# Patient Record
Sex: Female | Born: 1935 | Smoking: Former smoker
Health system: Southern US, Community
[De-identification: ages and names within clinical notes are randomized; demographics above are authoritative.]

## PROBLEM LIST (undated history)

## (undated) DIAGNOSIS — C50919 Malignant neoplasm of unspecified site of unspecified female breast: Secondary | ICD-10-CM

## (undated) DIAGNOSIS — K219 Gastro-esophageal reflux disease without esophagitis: Secondary | ICD-10-CM

## (undated) DIAGNOSIS — Z8719 Personal history of other diseases of the digestive system: Secondary | ICD-10-CM

## (undated) DIAGNOSIS — I10 Essential (primary) hypertension: Secondary | ICD-10-CM

## (undated) DIAGNOSIS — N189 Chronic kidney disease, unspecified: Secondary | ICD-10-CM

## (undated) DIAGNOSIS — R600 Localized edema: Secondary | ICD-10-CM

## (undated) DIAGNOSIS — M1990 Unspecified osteoarthritis, unspecified site: Secondary | ICD-10-CM

## (undated) HISTORY — DX: Malignant neoplasm of unspecified site of unspecified female breast: C50.919

## (undated) HISTORY — DX: Localized edema: R60.0

## (undated) HISTORY — DX: Essential (primary) hypertension: I10

## (undated) HISTORY — PX: SHOULDER SURGERY: SHX246

## (undated) HISTORY — DX: Unspecified osteoarthritis, unspecified site: M19.90

## (undated) HISTORY — DX: Gastro-esophageal reflux disease without esophagitis: K21.9

## (undated) HISTORY — DX: Personal history of other diseases of the digestive system: Z87.19

## (undated) HISTORY — PX: HIP ARTHROPLASTY: SHX981

## (undated) HISTORY — PX: CHOLECYSTECTOMY: SHX55

## (undated) HISTORY — PX: MASTECTOMY: SHX3

## (undated) HISTORY — DX: Chronic kidney disease, unspecified: N18.9

---

## 2004-01-13 ENCOUNTER — Other Ambulatory Visit: Payer: Self-pay

## 2004-07-30 ENCOUNTER — Emergency Department: Payer: Self-pay | Admitting: Unknown Physician Specialty

## 2004-12-16 ENCOUNTER — Emergency Department: Payer: Self-pay | Admitting: Emergency Medicine

## 2005-10-09 ENCOUNTER — Other Ambulatory Visit: Payer: Self-pay

## 2005-10-09 ENCOUNTER — Emergency Department: Payer: Self-pay | Admitting: Emergency Medicine

## 2005-10-15 ENCOUNTER — Other Ambulatory Visit: Payer: Self-pay

## 2005-10-15 ENCOUNTER — Inpatient Hospital Stay: Payer: Self-pay | Admitting: Internal Medicine

## 2005-12-31 ENCOUNTER — Ambulatory Visit: Payer: Self-pay | Admitting: Internal Medicine

## 2006-01-11 ENCOUNTER — Ambulatory Visit: Payer: Self-pay | Admitting: Internal Medicine

## 2006-01-23 ENCOUNTER — Emergency Department: Payer: Self-pay | Admitting: Emergency Medicine

## 2006-01-28 ENCOUNTER — Ambulatory Visit: Payer: Self-pay | Admitting: Surgery

## 2006-01-28 ENCOUNTER — Other Ambulatory Visit: Payer: Self-pay

## 2006-02-15 ENCOUNTER — Ambulatory Visit: Payer: Self-pay | Admitting: Surgery

## 2006-03-20 ENCOUNTER — Inpatient Hospital Stay: Payer: Self-pay | Admitting: Internal Medicine

## 2006-03-20 ENCOUNTER — Other Ambulatory Visit: Payer: Self-pay

## 2006-04-16 ENCOUNTER — Inpatient Hospital Stay: Payer: Self-pay | Admitting: Surgery

## 2006-11-06 ENCOUNTER — Other Ambulatory Visit: Payer: Self-pay

## 2006-11-06 ENCOUNTER — Ambulatory Visit: Payer: Self-pay | Admitting: Specialist

## 2006-11-20 ENCOUNTER — Ambulatory Visit: Payer: Self-pay | Admitting: Specialist

## 2006-12-14 ENCOUNTER — Other Ambulatory Visit: Payer: Self-pay

## 2006-12-14 ENCOUNTER — Inpatient Hospital Stay: Payer: Self-pay | Admitting: Internal Medicine

## 2006-12-15 ENCOUNTER — Other Ambulatory Visit: Payer: Self-pay

## 2007-01-14 ENCOUNTER — Emergency Department: Payer: Self-pay | Admitting: Emergency Medicine

## 2007-02-12 ENCOUNTER — Other Ambulatory Visit: Payer: Self-pay

## 2007-02-12 ENCOUNTER — Inpatient Hospital Stay: Payer: Self-pay | Admitting: Internal Medicine

## 2007-12-02 ENCOUNTER — Emergency Department: Payer: Self-pay | Admitting: Unknown Physician Specialty

## 2007-12-02 ENCOUNTER — Other Ambulatory Visit: Payer: Self-pay

## 2008-11-10 ENCOUNTER — Emergency Department: Payer: Self-pay | Admitting: Emergency Medicine

## 2009-04-20 ENCOUNTER — Emergency Department: Payer: Self-pay | Admitting: Emergency Medicine

## 2009-05-26 ENCOUNTER — Emergency Department: Payer: Self-pay | Admitting: Internal Medicine

## 2009-05-31 ENCOUNTER — Inpatient Hospital Stay: Payer: Self-pay | Admitting: Internal Medicine

## 2009-07-16 ENCOUNTER — Emergency Department: Payer: Self-pay | Admitting: Internal Medicine

## 2009-08-06 ENCOUNTER — Observation Stay: Payer: Self-pay | Admitting: Internal Medicine

## 2010-11-21 ENCOUNTER — Inpatient Hospital Stay: Payer: Self-pay | Admitting: Internal Medicine

## 2010-11-21 ENCOUNTER — Encounter: Payer: Self-pay | Admitting: Cardiovascular Disease

## 2010-11-21 DIAGNOSIS — R079 Chest pain, unspecified: Secondary | ICD-10-CM

## 2010-11-21 DIAGNOSIS — I369 Nonrheumatic tricuspid valve disorder, unspecified: Secondary | ICD-10-CM

## 2010-11-22 ENCOUNTER — Encounter: Payer: Self-pay | Admitting: Cardiovascular Disease

## 2010-12-01 ENCOUNTER — Ambulatory Visit: Payer: Medicare Other | Admitting: Cardiovascular Disease

## 2010-12-12 ENCOUNTER — Encounter: Payer: Self-pay | Admitting: Cardiovascular Disease

## 2010-12-12 ENCOUNTER — Ambulatory Visit (INDEPENDENT_AMBULATORY_CARE_PROVIDER_SITE_OTHER): Payer: Medicare Other | Admitting: Cardiovascular Disease

## 2010-12-12 VITALS — BP 130/62 | HR 53 | Wt 150.0 lb

## 2010-12-12 DIAGNOSIS — I10 Essential (primary) hypertension: Secondary | ICD-10-CM | POA: Insufficient documentation

## 2010-12-12 DIAGNOSIS — N19 Unspecified kidney failure: Secondary | ICD-10-CM | POA: Insufficient documentation

## 2010-12-12 DIAGNOSIS — R0602 Shortness of breath: Secondary | ICD-10-CM | POA: Insufficient documentation

## 2010-12-12 DIAGNOSIS — I2789 Other specified pulmonary heart diseases: Secondary | ICD-10-CM

## 2010-12-12 DIAGNOSIS — R609 Edema, unspecified: Secondary | ICD-10-CM

## 2010-12-12 DIAGNOSIS — I272 Pulmonary hypertension, unspecified: Secondary | ICD-10-CM | POA: Insufficient documentation

## 2010-12-12 NOTE — Assessment & Plan Note (Signed)
Mild to moderate pulmonary hypertension seen on echocardiogram in the hospital. She is relatively asymptomatic with minimal shortness of breath on today's visit.Marland Kitchen

## 2010-12-12 NOTE — Assessment & Plan Note (Addendum)
Edema in her legs appears to have improved by holding amlodipine and wearing TED hose.  She does have edema in her right hand. I encouraged her to use a pillow under her hand when sitting. She could use an Ace wrap periodically on her right hand and forearm as needed for significant swelling.

## 2010-12-12 NOTE — Progress Notes (Signed)
   Patient ID: Krista Harper, female    DOB: [birth year], 77 y.o.   MRN: [id number]  HPI Comments: Krista Harper is a very pleasant 77 year old woman with a history of falls, hypertension, osteoarthritis, chronic lower extremity swelling, breast cancer with mastectomy on the left, chronic kidney disease who recently presented to the hospital with right-sided chest pain. She was diagnosed with muscular ligamental strain after she heard a pop while moving.  Echocardiogram showed mild to moderately elevated right ventricular systolic pressures with normal LV function. Her blood pressure was an issue in the hospital. Her amlodipine was held, metoprolol was held for bradycardia, she was started on hydralazine 25 q.i.d.  She presented today and reports that she feels well. She is walking with PT using her walker. She denies any significant shortness of breath. She saw Dr. Cherylann Ratel yesterday and her metolazone was discontinued. Blood work was drawn. She did have renal failure in the hospital with exacerbation from overdiuresis.  Blood pressure numbers taken daily show systolic pressures in the 160s to 170 range  EKG today shows normal sinus rhythm with right bundle-branch block, rate 53 beats per minute     Review of Systems  HENT: Negative.   Eyes: Negative.   Respiratory: Negative.   Cardiovascular: Positive for leg swelling.  Gastrointestinal: Negative.   Musculoskeletal: Negative.        Hand and forearm swelling on the right  Skin: Negative.   Neurological: Positive for weakness.  Hematological: Negative.   Psychiatric/Behavioral: Negative.   All other systems reviewed and are negative.    BP 130/62  Pulse 53  Wt 150 lb (68.04 kg)   Physical Exam  Nursing note and vitals reviewed. Constitutional: She is oriented to person, place, and time. She appears well-developed and well-nourished.       Appears comfortable, sitting in a wheelchair  HENT:  Head: Normocephalic.  Nose: Nose  normal.  Mouth/Throat: Oropharynx is clear and moist.  Eyes: Conjunctivae are normal. Pupils are equal, round, and reactive to light.  Neck: Normal range of motion. Neck supple. No JVD present.  Cardiovascular: Normal rate, regular rhythm, S1 normal, S2 normal, normal heart sounds and intact distal pulses.  Exam reveals no gallop and no friction rub.   No murmur heard. Pulmonary/Chest: Effort normal and breath sounds normal. No respiratory distress. She has no wheezes. She has no rales. She exhibits no tenderness.  Abdominal: Soft. Bowel sounds are normal. She exhibits no distension. There is no tenderness.  Musculoskeletal: Normal range of motion. She exhibits edema. She exhibits no tenderness.       TED hose in place bilaterally. Mild swelling of her right hand and proximal forearm  Lymphadenopathy:    She has no cervical adenopathy.  Neurological: She is alert and oriented to person, place, and time. Coordination normal.  Skin: Skin is warm and dry. No rash noted. No erythema.  Psychiatric: She has a normal mood and affect. Her behavior is normal. Judgment and thought content normal.         Assessment and Plan

## 2010-12-12 NOTE — Assessment & Plan Note (Addendum)
Blood pressure is elevated over the past week. We will increase her hydralazine to 50 mg q.i.d. With close monitoring. I have asked the blood pressure numbers to be faxed to the office in the next 2-3 weeks.

## 2010-12-12 NOTE — Assessment & Plan Note (Addendum)
Stage III renal failure. Renal function is followed by Dr. Cherylann Ratel. Gentle diuresis for shortness of breath, pulmonary hypertension with close monitoring of her renal function. The nurse accompanying the patient reports that labs were drawn yesterday.

## 2011-01-08 ENCOUNTER — Emergency Department: Payer: Self-pay | Admitting: *Deleted

## 2011-01-18 ENCOUNTER — Emergency Department: Payer: Self-pay | Admitting: Emergency Medicine

## 2011-02-21 ENCOUNTER — Ambulatory Visit: Payer: Self-pay | Admitting: Internal Medicine

## 2011-03-01 ENCOUNTER — Ambulatory Visit: Payer: Self-pay | Admitting: Internal Medicine

## 2011-03-05 ENCOUNTER — Observation Stay: Payer: Self-pay | Admitting: Specialist

## 2011-03-24 ENCOUNTER — Ambulatory Visit: Payer: Self-pay | Admitting: Internal Medicine

## 2011-04-09 ENCOUNTER — Emergency Department: Payer: Self-pay | Admitting: *Deleted

## 2011-04-23 ENCOUNTER — Ambulatory Visit: Payer: Self-pay | Admitting: Internal Medicine

## 2011-05-24 ENCOUNTER — Ambulatory Visit: Payer: Self-pay | Admitting: Internal Medicine

## 2011-06-13 ENCOUNTER — Inpatient Hospital Stay: Payer: Self-pay | Admitting: Internal Medicine

## 2011-06-18 ENCOUNTER — Ambulatory Visit: Payer: Medicare Other | Admitting: Cardiovascular Disease

## 2011-06-20 ENCOUNTER — Inpatient Hospital Stay: Payer: Self-pay | Admitting: Internal Medicine

## 2011-06-23 ENCOUNTER — Ambulatory Visit: Payer: Self-pay | Admitting: Internal Medicine

## 2011-06-29 ENCOUNTER — Ambulatory Visit: Payer: Medicare Other | Admitting: Cardiovascular Disease

## 2011-06-30 ENCOUNTER — Emergency Department: Payer: Self-pay | Admitting: Internal Medicine

## 2011-07-04 ENCOUNTER — Encounter: Payer: Self-pay | Admitting: Cardiovascular Disease

## 2011-07-04 ENCOUNTER — Ambulatory Visit (INDEPENDENT_AMBULATORY_CARE_PROVIDER_SITE_OTHER): Payer: Medicare Other | Admitting: Cardiovascular Disease

## 2011-07-04 DIAGNOSIS — I2789 Other specified pulmonary heart diseases: Secondary | ICD-10-CM

## 2011-07-04 DIAGNOSIS — I272 Pulmonary hypertension, unspecified: Secondary | ICD-10-CM

## 2011-07-04 DIAGNOSIS — I10 Essential (primary) hypertension: Secondary | ICD-10-CM

## 2011-07-04 DIAGNOSIS — R609 Edema, unspecified: Secondary | ICD-10-CM

## 2011-07-04 DIAGNOSIS — N19 Unspecified kidney failure: Secondary | ICD-10-CM

## 2011-07-04 NOTE — Assessment & Plan Note (Signed)
I suggested she take Bumex b.i.d. Only for worsening shortness of breath and then back to daily.  She has followup with Dr. Cherylann Ratel in one month

## 2011-07-04 NOTE — Progress Notes (Signed)
Patient ID: Krista Harper, female    DOB: Jun 26, 1923, 77 y.o.   MRN: [id number]  HPI Comments: Krista Harper is a very pleasant 77 year old woman with a history of falls, hypertension, osteoarthritis, chronic lower extremity swelling, breast cancer with mastectomy on the left, chronic kidney disease seen by Dr. Cherylann Ratel, h/o  right-sided chest pain in the past with muscular ligamental strain after she heard a pop while moving, With 2 recent hospital admissions in the past month, one for shortness of breath felt to be secondary to diastolic dysfunction and the followup admission for trauma to her left face and head after a fall when she hit the bed rail.   She presents in a wheelchair with a bandage on her left for head and large region of ecchymosis around her left eye. She reports that her breathing is good. She was started on Bumex at the time of discharge. She does not want to wear TED hose and reports her edema has been stable. Recently, her room was cleared of clots of cluster at her nursing home. It was felt that this clot or might be contributing to her falls. Prior to her recent admissions, her caretaker reports that she had been doing well and was active. No chest pain recently. Metoprolol had been held in the past secondary to bradycardia though she presents today on metoprolol tartrate 100 mg b.i.d.Marland Kitchen  Blood pressure is mildly elevatedWith systolic pressures in the 140-160 range.  Previous Echocardiogram showed mild to moderately elevated right ventricular systolic pressures with normal LV function.  EKG today shows normal sinus rhythm with right bundle-branch block, rate 49 beats per minute   Outpatient Encounter Prescriptions as of 07/04/2011  Medication Sig Dispense Refill  . ARTIFICIAL TEAR SOLUTION OP Apply 1 drop to eye 2 (two) times daily.        . bumetanide (BUMEX) 1 MG tablet Take 1 mg by mouth daily.        . calcium-vitamin D (OSCAL WITH D) 500-200 MG-UNIT per tablet Take 1  tablet by mouth 2 (two) times daily.        . ferrous sulfate 325 (65 FE) MG tablet Take 325 mg by mouth 3 (three) times daily with meals.        . hydrALAZINE (APRESOLINE) 100 MG tablet Take 100 mg by mouth 3 (three) times daily.        . hydrOXYzine (VISTARIL) 25 MG capsule Take 25 mg by mouth at bedtime.        . magnesium hydroxide (MILK OF MAGNESIA) 400 MG/5ML suspension Take 5 mLs by mouth daily as needed.        . metoprolol (LOPRESSOR) 50 MG tablet Take 2 tablets (100 mg total) by mouth 2 (two) times daily.  180 tablet  4  . mirtazapine (REMERON) 15 MG tablet Take 1/2 tablet at bedtime.       . nitroGLYCERIN (NITRODUR - DOSED IN MG/24 HR) 0.3 mg/hr Place 1 patch onto the skin daily.        Marland Kitchen oxybutynin (DITROPAN-XL) 5 MG 24 hr tablet Take 5 mg by mouth daily.        Marland Kitchen oxyCODONE-acetaminophen (PERCOCET) 5-325 MG per tablet Take 1 tablet by mouth every 4 (four) hours as needed.        . pantoprazole (PROTONIX) 40 MG tablet Take 40 mg by mouth daily.           Review of Systems  Constitutional:       Presenting in  a wheelchair, predominantly nonverbal  HENT: Negative.        Large ecchymosis around her left eye with bandage on her left forehead  Eyes: Negative.   Respiratory: Negative.   Cardiovascular: Positive for leg swelling.  Gastrointestinal: Negative.   Musculoskeletal: Negative.        Hand and forearm swelling on the right  Skin: Negative.   Neurological: Positive for weakness.  Hematological: Negative.   Psychiatric/Behavioral: Negative.   All other systems reviewed and are negative.    BP 155/77  Wt 138 lb 6 oz (62.766 kg) Rate 49 bpm   Physical Exam  Nursing note and vitals reviewed. Constitutional: She is oriented to person, place, and time. She appears well-developed and well-nourished.       Appears comfortable, sitting in a wheelchair  HENT:  Head: Normocephalic.  Nose: Nose normal.  Mouth/Throat: Oropharynx is clear and moist.  Eyes: Conjunctivae  are normal. Pupils are equal, round, and reactive to light.  Neck: Normal range of motion. Neck supple. No JVD present.  Cardiovascular: Normal rate, regular rhythm, S1 normal, S2 normal, normal heart sounds and intact distal pulses.  Exam reveals no gallop and no friction rub.   No murmur heard. Pulmonary/Chest: Effort normal. No respiratory distress. She has no wheezes. She has rales. She exhibits no tenderness.  Abdominal: Soft. Bowel sounds are normal. There is no tenderness.  Musculoskeletal: Normal range of motion. She exhibits edema. She exhibits no tenderness.       TED hose in place bilaterally. Mild swelling of her right hand and proximal forearm  Lymphadenopathy:    She has no cervical adenopathy.  Neurological: She is alert and oriented to person, place, and time. Coordination normal.  Skin: Skin is warm and dry. No rash noted. No erythema.  Psychiatric: She has a normal mood and affect. Her behavior is normal. Judgment and thought content normal.         Assessment and Plan

## 2011-07-04 NOTE — Assessment & Plan Note (Signed)
Blood pressure is mildly elevated today. She is bradycardic and we will decrease her metoprolol to 50 mg b.i.d.. I suspect we will need to wean her off beta blockers given a previous problem with bradycardia. Uncertain if this is contributing to her falls.   If her blood pressure continues to trend upwards, she was previously on hydralazine t.i.d. And this could be reinitiated.

## 2011-07-04 NOTE — Patient Instructions (Signed)
You are doing well. Please decrease the metoprolol to 50 mg twice a day  Please call us if you have new issues that need to be addressed before your next appt.  The office will contact you for a follow up Appt. In 3 months

## 2011-07-04 NOTE — Assessment & Plan Note (Signed)
Followed by Dr. Cherylann Ratel.  Elevated creatinine and BUN from underlying renal dysfunction. She continues to need diuresis for underlying diastolic his function.  We'll have to proceed cautiously. I suggested she take an extra Bumex for worsening shortness of breath.

## 2011-07-04 NOTE — Assessment & Plan Note (Signed)
Mild edema on today's visit. Suspect there is a component of venous insufficiency. She does not like wearing TED hose.

## 2011-07-05 ENCOUNTER — Ambulatory Visit: Payer: Self-pay | Admitting: Internal Medicine

## 2011-07-19 ENCOUNTER — Ambulatory Visit: Payer: Self-pay | Admitting: Internal Medicine

## 2011-07-24 ENCOUNTER — Ambulatory Visit: Payer: Self-pay | Admitting: Internal Medicine

## 2011-08-01 ENCOUNTER — Observation Stay: Payer: Self-pay | Admitting: Specialist

## 2011-08-01 LAB — COMPREHENSIVE METABOLIC PANEL
Alkaline Phosphatase: 77 U/L (ref 50–136)
Calcium, Total: 8.4 mg/dL — ABNORMAL LOW (ref 8.5–10.1)
Chloride: 101 mmol/L (ref 98–107)
Co2: 28 mmol/L (ref 21–32)
EGFR (African American): 25 — ABNORMAL LOW
EGFR (Non-African Amer.): 20 — ABNORMAL LOW
Glucose: 104 mg/dL — ABNORMAL HIGH (ref 65–99)
SGPT (ALT): 11 U/L — ABNORMAL LOW

## 2011-08-01 LAB — CBC
HCT: 29.8 % — ABNORMAL LOW (ref 35.0–47.0)
HGB: 9.8 g/dL — ABNORMAL LOW (ref 12.0–16.0)
MCHC: 32.7 g/dL (ref 32.0–36.0)
MCV: 97 fL (ref 80–100)
RBC: 3.09 10*6/uL — ABNORMAL LOW (ref 3.80–5.20)
RDW: 15.6 % — ABNORMAL HIGH (ref 11.5–14.5)
WBC: 6.9 10*3/uL (ref 3.6–11.0)

## 2011-08-01 LAB — TROPONIN I: Troponin-I: <0.02 ng/mL

## 2011-08-01 LAB — PRO B NATRIURETIC PEPTIDE: B-Type Natriuretic Peptide: 38880 pg/mL — ABNORMAL HIGH (ref 0–450)

## 2011-08-02 LAB — URINALYSIS, COMPLETE
Bilirubin,UR: NEGATIVE
Blood: NEGATIVE
Ketone: NEGATIVE
RBC,UR: 1 /HPF (ref 0–5)
Squamous Epithelial: NONE SEEN
WBC UR: 7 /HPF (ref 0–5)

## 2011-08-02 LAB — CBC WITH DIFFERENTIAL/PLATELET
Eosinophil %: 1.2 %
HCT: 27.6 % — ABNORMAL LOW (ref 35.0–47.0)
Lymphocyte #: 0.6 10*3/uL — ABNORMAL LOW (ref 1.0–3.6)
Lymphocyte %: 11.2 %
Monocyte %: 1.4 %
RBC: 2.88 10*6/uL — ABNORMAL LOW (ref 3.80–5.20)
RDW: 15.4 % — ABNORMAL HIGH (ref 11.5–14.5)
WBC: 4.9 10*3/uL (ref 3.6–11.0)

## 2011-08-02 LAB — BASIC METABOLIC PANEL
Anion Gap: 11 (ref 7–16)
Calcium, Total: 8.6 mg/dL (ref 8.5–10.1)
Chloride: 100 mmol/L (ref 98–107)
Co2: 26 mmol/L (ref 21–32)
Creatinine: 2.37 mg/dL — ABNORMAL HIGH (ref 0.60–1.30)

## 2011-08-02 LAB — TROPONIN I
Troponin-I: <0.02 ng/mL
Troponin-I: <0.02 ng/mL

## 2011-08-02 LAB — CK TOTAL AND CKMB (NOT AT ARMC)
CK, Total: 19 U/L — ABNORMAL LOW (ref 21–215)
CK-MB: <0.5 ng/mL — ABNORMAL LOW (ref 0.5–3.6)

## 2011-08-16 ENCOUNTER — Ambulatory Visit: Payer: Self-pay | Admitting: Internal Medicine

## 2011-08-24 ENCOUNTER — Ambulatory Visit: Payer: Self-pay | Admitting: Internal Medicine

## 2011-08-30 LAB — CANCER CENTER HEMOGLOBIN: HGB: 9.9 g/dL — ABNORMAL LOW (ref 12.0–16.0)

## 2011-09-23 ENCOUNTER — Emergency Department: Payer: Self-pay | Admitting: *Deleted

## 2011-09-24 ENCOUNTER — Ambulatory Visit: Payer: Self-pay | Admitting: Internal Medicine

## 2011-09-27 LAB — CANCER CENTER HEMOGLOBIN: HGB: 10 g/dL — ABNORMAL LOW (ref 12.0–16.0)

## 2011-10-02 ENCOUNTER — Ambulatory Visit: Payer: Medicare Other | Admitting: Cardiovascular Disease

## 2011-10-11 LAB — IRON AND TIBC
Iron Bind.Cap.(Total): 217 ug/dL — ABNORMAL LOW (ref 250–450)
Iron Saturation: 22 %
Unbound Iron-Bind.Cap.: 169 ug/dL

## 2011-10-11 LAB — CANCER CENTER HEMOGLOBIN: HGB: 10 g/dL — ABNORMAL LOW (ref 12.0–16.0)

## 2011-10-11 LAB — FERRITIN: Ferritin (ARMC): 150 ng/mL (ref 8–388)

## 2011-10-22 ENCOUNTER — Ambulatory Visit: Payer: Self-pay | Admitting: Internal Medicine

## 2011-10-25 ENCOUNTER — Ambulatory Visit (INDEPENDENT_AMBULATORY_CARE_PROVIDER_SITE_OTHER): Payer: Medicare Other | Admitting: Cardiovascular Disease

## 2011-10-25 ENCOUNTER — Encounter: Payer: Self-pay | Admitting: Cardiovascular Disease

## 2011-10-25 VITALS — BP 140/64 | HR 55 | Ht 61.0 in | Wt 134.0 lb

## 2011-10-25 DIAGNOSIS — I10 Essential (primary) hypertension: Secondary | ICD-10-CM

## 2011-10-25 DIAGNOSIS — R609 Edema, unspecified: Secondary | ICD-10-CM

## 2011-10-25 DIAGNOSIS — R001 Bradycardia, unspecified: Secondary | ICD-10-CM | POA: Insufficient documentation

## 2011-10-25 DIAGNOSIS — I498 Other specified cardiac arrhythmias: Secondary | ICD-10-CM

## 2011-10-25 DIAGNOSIS — R0602 Shortness of breath: Secondary | ICD-10-CM

## 2011-10-25 LAB — CBC CANCER CENTER
Basophil #: 0 x10 3/mm (ref 0.0–0.1)
Eosinophil #: 0.3 x10 3/mm (ref 0.0–0.7)
HGB: 9.9 g/dL — ABNORMAL LOW (ref 12.0–16.0)
Lymphocyte #: 1.4 x10 3/mm (ref 1.0–3.6)
Lymphocyte %: 27.1 %
MCH: 31.2 pg (ref 26.0–34.0)
MCHC: 32.7 g/dL (ref 32.0–36.0)
MCV: 95.6 fL (ref 80–100)
Monocyte #: 0.4 x10 3/mm (ref 0.0–0.7)
Neutrophil #: 3.1 x10 3/mm (ref 1.4–6.5)
Neutrophil %: 59 %
RBC: 3.18 10*6/uL — ABNORMAL LOW (ref 3.80–5.20)
RDW: 12.6 % (ref 11.5–14.5)

## 2011-10-25 NOTE — Assessment & Plan Note (Signed)
Blood pressure is borderline elevated. We have not made any medication changes today.

## 2011-10-25 NOTE — Progress Notes (Signed)
Patient ID: Krista Harper, female    DOB: [birth year], 78 y.o.   MRN: [id number]  HPI Comments: Krista Harper is a very pleasant 78 year old woman with a history of falls, hypertension, osteoarthritis, chronic lower extremity swelling, breast cancer with mastectomy on the left, chronic kidney disease seen by Dr. Cherylann Ratel, h/o  right-sided chest pain in the past with muscular ligamental strain after she heard a pop while moving, With 2  hospital admissions, one for shortness of breath felt to be secondary to diastolic dysfunction and the followup admission for trauma to her left face and head after a fall when she hit the bed rail.   On her last clinic visit, she had bradycardia with heart rate of 49. We suggested she decrease her metoprolol to 50 mg twice a day. Notes indicate that she has continued on 100 mg twice a day. Heart rate fluctuates between 55 and 70 beats per minute over the past week. Heart rate 55 beats per minute today. She has minimal lower extremity edema, is not very active at baseline. She denies any significant shortness of breath or chest pain. Weight has been relatively stable and she is eating well. Overall she has no complaints except for being cold.  She had a Procrit shot today with a goal hemoglobin more than 10, seen by Dr. Sherrlyn Hock.  Blood pressure typically in the 140-150 range. She is wearing TED hose.  Previous Echocardiogram showed mild to moderately elevated right ventricular systolic pressures with normal LV function.  EKG today shows normal sinus rhythm with right bundle-branch block, rate 55 beats per minute   Outpatient Encounter Prescriptions as of 10/25/2011  Medication Sig Dispense Refill  . ARTIFICIAL TEAR SOLUTION OP Apply 1 drop to eye 2 (two) times daily.        . bumetanide (BUMEX) 1 MG tablet Take 1 mg by mouth daily.        . calcium-vitamin D (OSCAL WITH D) 500-200 MG-UNIT per tablet Take 1 tablet by mouth 2 (two) times daily.        . colchicine 0.6 MG  tablet Take 0.6 mg by mouth daily.      . ferrous sulfate 325 (65 FE) MG tablet Take 325 mg by mouth 3 (three) times daily with meals.        . hydrALAZINE (APRESOLINE) 100 MG tablet Take 100 mg by mouth 3 (three) times daily.        . hydrOXYzine (VISTARIL) 25 MG capsule Take 25 mg by mouth at bedtime.        Marland Kitchen ipratropium-albuterol (DUONEB) 0.5-2.5 (3) MG/3ML SOLN Take 3 mLs by nebulization every 6 (six) hours as needed.      . magnesium hydroxide (MILK OF MAGNESIA) 400 MG/5ML suspension Take 5 mLs by mouth daily as needed.        . metoprolol (LOPRESSOR) 100 MG tablet Take 50 mg by mouth 2 (two) times daily.       . nitroGLYCERIN (NITRODUR - DOSED IN MG/24 HR) 0.3 mg/hr Place 1 patch onto the skin daily.        Marland Kitchen oxybutynin (DITROPAN-XL) 5 MG 24 hr tablet Take 5 mg by mouth daily.        Marland Kitchen oxyCODONE-acetaminophen (PERCOCET) 5-325 MG per tablet Take 1 tablet by mouth every 4 (four) hours as needed.        . pantoprazole (PROTONIX) 40 MG tablet Take 40 mg by mouth daily.          Review of  Systems  Constitutional:       Presenting in a wheelchair, predominantly nonverbal  HENT: Negative.        Large ecchymosis around her left eye with bandage on her left forehead  Eyes: Negative.   Respiratory: Negative.   Cardiovascular: Positive for leg swelling.  Gastrointestinal: Negative.   Musculoskeletal: Negative.        Hand and forearm swelling on the right  Skin: Negative.   Neurological: Positive for weakness.  Hematological: Negative.   Psychiatric/Behavioral: Negative.   All other systems reviewed and are negative.    BP 140/64  Pulse 55  Ht 5\' 1"  (1.549 m)  Wt 134 lb (60.782 kg)  BMI 25.32 kg/m2  Physical Exam  Nursing note and vitals reviewed. Constitutional: She is oriented to person, place, and time. She appears well-developed and well-nourished.       Appears comfortable, sitting in a wheelchair  HENT:  Head: Normocephalic.  Nose: Nose normal.  Mouth/Throat:  Oropharynx is clear and moist.  Eyes: Conjunctivae are normal. Pupils are equal, round, and reactive to light.  Neck: Normal range of motion. Neck supple. No JVD present.  Cardiovascular: Normal rate, regular rhythm, S1 normal, S2 normal, normal heart sounds and intact distal pulses.  Exam reveals no gallop and no friction rub.   No murmur heard. Pulmonary/Chest: Effort normal. No respiratory distress. She has no wheezes. She has rales. She exhibits no tenderness.  Abdominal: Soft. Bowel sounds are normal. She exhibits no distension. There is no tenderness.  Musculoskeletal: Normal range of motion. She exhibits edema. She exhibits no tenderness.       TED hose in place bilaterally. Mild swelling of her right hand and proximal forearm  Lymphadenopathy:    She has no cervical adenopathy.  Neurological: She is alert and oriented to person, place, and time. Coordination normal.  Skin: Skin is warm and dry. No rash noted. No erythema.  Psychiatric: She has a normal mood and affect. Her behavior is normal. Judgment and thought content normal.         Assessment and Plan

## 2011-10-25 NOTE — Assessment & Plan Note (Signed)
Heart rate seems to fluctuate in the mid 50s upwards. We will suggest if her heart rate does trend lower, that they decrease the metoprolol to 50 mg twice a day.

## 2011-10-25 NOTE — Assessment & Plan Note (Signed)
Mild diffuse rales on exam. This could be secondary to underlying lung disease. Unable to exclude mild heart failure. As she is relatively asymptomatic, no further medication changes were made. We will recommend extra Bumex as needed for worsening edema or shortness of breath.

## 2011-10-25 NOTE — Assessment & Plan Note (Signed)
Trace edema around her ankles. Appears to be stable on her current diuretic regimen.

## 2011-10-25 NOTE — Patient Instructions (Addendum)
You are doing well. No medication changes were made.  Please decrease the metoprolol to 50 mg BID for heart rates less than 55. Give an extra bumex as needed for worsening shortness of breath.   Please call us if you have new issues that need to be addressed before your next appt.  Your physician wants you to follow-up in: 6 months.  You will receive a reminder letter in the mail two months in advance. If you don't receive a letter, please call our office to schedule the follow-up appointment.

## 2011-11-08 LAB — CANCER CENTER HEMOGLOBIN: HGB: 10.2 g/dL — ABNORMAL LOW (ref 12.0–16.0)

## 2011-11-21 ENCOUNTER — Ambulatory Visit: Payer: Self-pay | Admitting: Internal Medicine

## 2011-12-06 LAB — CANCER CENTER HEMOGLOBIN: HGB: 9.9 g/dL — ABNORMAL LOW (ref 12.0–16.0)

## 2011-12-11 ENCOUNTER — Emergency Department: Payer: Self-pay | Admitting: Emergency Medicine

## 2011-12-11 LAB — CBC
HCT: 31.4 % — ABNORMAL LOW (ref 35.0–47.0)
MCH: 31.7 pg (ref 26.0–34.0)
MCHC: 33.1 g/dL (ref 32.0–36.0)
MCV: 96 fL (ref 80–100)
Platelet: 220 10*3/uL (ref 150–440)
RBC: 3.29 10*6/uL — ABNORMAL LOW (ref 3.80–5.20)
RDW: 14 % (ref 11.5–14.5)

## 2011-12-11 LAB — BASIC METABOLIC PANEL
Calcium, Total: 8.8 mg/dL (ref 8.5–10.1)
Chloride: 98 mmol/L (ref 98–107)
Co2: 26 mmol/L (ref 21–32)
Creatinine: 2.79 mg/dL — ABNORMAL HIGH (ref 0.60–1.30)
EGFR (African American): 17 — ABNORMAL LOW
Glucose: 146 mg/dL — ABNORMAL HIGH (ref 65–99)
Osmolality: 289 (ref 275–301)
Sodium: 134 mmol/L — ABNORMAL LOW (ref 136–145)

## 2011-12-20 LAB — CANCER CENTER HEMOGLOBIN: HGB: 9.2 g/dL — ABNORMAL LOW (ref 12.0–16.0)

## 2012-01-03 ENCOUNTER — Ambulatory Visit: Payer: Self-pay | Admitting: Internal Medicine

## 2012-01-03 LAB — CANCER CENTER HEMOGLOBIN: HGB: 9.1 g/dL — ABNORMAL LOW (ref 12.0–16.0)

## 2012-01-08 ENCOUNTER — Inpatient Hospital Stay: Payer: Self-pay | Admitting: Internal Medicine

## 2012-01-08 LAB — PRO B NATRIURETIC PEPTIDE: B-Type Natriuretic Peptide: 39770 pg/mL — ABNORMAL HIGH (ref 0–450)

## 2012-01-08 LAB — CBC WITH DIFFERENTIAL/PLATELET
Basophil %: 0.4 %
Eosinophil #: 0.1 10*3/uL (ref 0.0–0.7)
Eosinophil %: 0.9 %
HGB: 9.4 g/dL — ABNORMAL LOW (ref 12.0–16.0)
Lymphocyte #: 0.9 10*3/uL — ABNORMAL LOW (ref 1.0–3.6)
Lymphocyte %: 9.2 %
MCH: 30.3 pg (ref 26.0–34.0)
MCHC: 31.6 g/dL — ABNORMAL LOW (ref 32.0–36.0)
MCV: 96 fL (ref 80–100)
Monocyte #: 0.7 x10 3/mm (ref 0.2–0.9)
Monocyte %: 7.8 %
Neutrophil #: 7.7 10*3/uL — ABNORMAL HIGH (ref 1.4–6.5)
Platelet: 327 10*3/uL (ref 150–440)
RBC: 3.1 10*6/uL — ABNORMAL LOW (ref 3.80–5.20)
RDW: 14.4 % (ref 11.5–14.5)

## 2012-01-08 LAB — COMPREHENSIVE METABOLIC PANEL
Albumin: 2.4 g/dL — ABNORMAL LOW (ref 3.4–5.0)
Calcium, Total: 8.5 mg/dL (ref 8.5–10.1)
Co2: 30 mmol/L (ref 21–32)
Creatinine: 4.14 mg/dL — ABNORMAL HIGH (ref 0.60–1.30)
EGFR (African American): 11 — ABNORMAL LOW
EGFR (Non-African Amer.): 9 — ABNORMAL LOW
Potassium: 3.1 mmol/L — ABNORMAL LOW (ref 3.5–5.1)
SGOT(AST): 20 U/L (ref 15–37)
SGPT (ALT): 11 U/L — ABNORMAL LOW
Total Protein: 7.5 g/dL (ref 6.4–8.2)

## 2012-01-08 LAB — TROPONIN I: Troponin-I: <0.02 ng/mL

## 2012-01-08 LAB — TSH: Thyroid Stimulating Horm: 1.01 u[IU]/mL

## 2012-01-09 DIAGNOSIS — I369 Nonrheumatic tricuspid valve disorder, unspecified: Secondary | ICD-10-CM

## 2012-01-09 LAB — CBC WITH DIFFERENTIAL/PLATELET
Eosinophil %: 0.9 %
HCT: 30.5 % — ABNORMAL LOW (ref 35.0–47.0)
HGB: 9.9 g/dL — ABNORMAL LOW (ref 12.0–16.0)
MCH: 31 pg (ref 26.0–34.0)
MCHC: 32.6 g/dL (ref 32.0–36.0)
MCV: 95 fL (ref 80–100)
Monocyte %: 1.5 %
Neutrophil #: 7.8 10*3/uL — ABNORMAL HIGH (ref 1.4–6.5)
Neutrophil %: 92.8 %
WBC: 8.4 10*3/uL (ref 3.6–11.0)

## 2012-01-09 LAB — IRON AND TIBC: Iron Bind.Cap.(Total): 193 ug/dL — ABNORMAL LOW (ref 250–450)

## 2012-01-09 LAB — BASIC METABOLIC PANEL
Anion Gap: 13 (ref 7–16)
Calcium, Total: 8.7 mg/dL (ref 8.5–10.1)
Chloride: 99 mmol/L (ref 98–107)
Co2: 27 mmol/L (ref 21–32)
Creatinine: 3.98 mg/dL — ABNORMAL HIGH (ref 0.60–1.30)
EGFR (African American): 11 — ABNORMAL LOW
Glucose: 132 mg/dL — ABNORMAL HIGH (ref 65–99)
Osmolality: 326 (ref 275–301)
Potassium: 3.2 mmol/L — ABNORMAL LOW (ref 3.5–5.1)

## 2012-01-09 LAB — PHOSPHORUS: Phosphorus: 4.8 mg/dL (ref 2.5–4.9)

## 2012-01-09 LAB — TROPONIN I: Troponin-I: <0.02 ng/mL

## 2012-01-10 LAB — BASIC METABOLIC PANEL
BUN: 103 mg/dL — ABNORMAL HIGH (ref 7–18)
BUN: 75 mg/dL — ABNORMAL HIGH (ref 7–18)
Calcium, Total: 8.7 mg/dL (ref 8.5–10.1)
Chloride: 98 mmol/L (ref 98–107)
Co2: 30 mmol/L (ref 21–32)
EGFR (African American): 14 — ABNORMAL LOW
EGFR (African American): 20 — ABNORMAL LOW
EGFR (Non-African Amer.): 12 — ABNORMAL LOW
Glucose: 89 mg/dL (ref 65–99)
Glucose: 95 mg/dL (ref 65–99)
Sodium: 138 mmol/L (ref 136–145)
Sodium: 138 mmol/L (ref 136–145)

## 2012-01-12 LAB — BASIC METABOLIC PANEL
Chloride: 102 mmol/L (ref 98–107)
Co2: 29 mmol/L (ref 21–32)
Creatinine: 1.76 mg/dL — ABNORMAL HIGH (ref 0.60–1.30)
EGFR (Non-African Amer.): 26 — ABNORMAL LOW
Glucose: 79 mg/dL (ref 65–99)
Osmolality: 293 (ref 275–301)
Potassium: 3.2 mmol/L — ABNORMAL LOW (ref 3.5–5.1)
Sodium: 145 mmol/L (ref 136–145)

## 2012-01-13 LAB — BASIC METABOLIC PANEL
Anion Gap: 6 — ABNORMAL LOW (ref 7–16)
BUN: 41 mg/dL — ABNORMAL HIGH (ref 7–18)
Calcium, Total: 8.8 mg/dL (ref 8.5–10.1)
Co2: 31 mmol/L (ref 21–32)
Creatinine: 2.15 mg/dL — ABNORMAL HIGH (ref 0.60–1.30)
EGFR (African American): 23 — ABNORMAL LOW
EGFR (Non-African Amer.): 20 — ABNORMAL LOW
Glucose: 86 mg/dL (ref 65–99)
Osmolality: 283 (ref 275–301)
Sodium: 137 mmol/L (ref 136–145)

## 2012-01-13 LAB — MAGNESIUM: Magnesium: 1.9 mg/dL

## 2012-01-13 LAB — PLATELET COUNT: Platelet: 205 10*3/uL (ref 150–440)

## 2012-01-14 LAB — BASIC METABOLIC PANEL
Anion Gap: 8 (ref 7–16)
BUN: 48 mg/dL — ABNORMAL HIGH (ref 7–18)
Chloride: 99 mmol/L (ref 98–107)
Co2: 28 mmol/L (ref 21–32)
Creatinine: 2.47 mg/dL — ABNORMAL HIGH (ref 0.60–1.30)
EGFR (Non-African Amer.): 17 — ABNORMAL LOW
Glucose: 84 mg/dL (ref 65–99)
Osmolality: 282 (ref 275–301)
Potassium: 4.5 mmol/L (ref 3.5–5.1)
Sodium: 135 mmol/L — ABNORMAL LOW (ref 136–145)

## 2012-01-15 LAB — BASIC METABOLIC PANEL
Anion Gap: 6 — ABNORMAL LOW (ref 7–16)
BUN: 57 mg/dL — ABNORMAL HIGH (ref 7–18)
Chloride: 99 mmol/L (ref 98–107)
Co2: 29 mmol/L (ref 21–32)
Creatinine: 2.61 mg/dL — ABNORMAL HIGH (ref 0.60–1.30)
EGFR (Non-African Amer.): 16 — ABNORMAL LOW
Osmolality: 283 (ref 275–301)
Potassium: 4.8 mmol/L (ref 3.5–5.1)
Sodium: 134 mmol/L — ABNORMAL LOW (ref 136–145)

## 2012-01-21 ENCOUNTER — Ambulatory Visit: Payer: Self-pay | Admitting: Internal Medicine

## 2012-01-24 ENCOUNTER — Other Ambulatory Visit: Payer: Self-pay | Admitting: Family Medicine

## 2012-01-24 LAB — BASIC METABOLIC PANEL
BUN: 103 mg/dL — ABNORMAL HIGH (ref 7–18)
Calcium, Total: 8.3 mg/dL — ABNORMAL LOW (ref 8.5–10.1)
EGFR (Non-African Amer.): 14 — ABNORMAL LOW
Glucose: 116 mg/dL — ABNORMAL HIGH (ref 65–99)
Potassium: 4.1 mmol/L (ref 3.5–5.1)
Sodium: 137 mmol/L (ref 136–145)

## 2012-07-23 DEATH — deceased

## 2013-04-23 IMAGING — NM NM LUNG SCAN
2 series · 16 of 16 positions shown · non-contrast
Comparison: none

REASON FOR EXAM: PE
COMMENTS:

[Series 1000: lung ventilation · 3.90mm/px · 4 acquisitions, 8 frames shown]
[im 1/4]
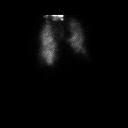
[im 1/4]
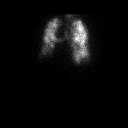
[im 2/4]
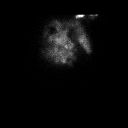
[im 2/4]
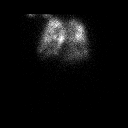
[im 3/4]
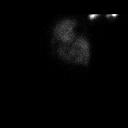
[im 3/4]
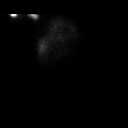
[im 4/4]
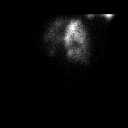
[im 4/4]
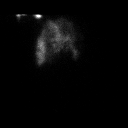

[Series 1000: lung perfusion · 1.95mm/px · 4 acquisitions, 8 frames shown]
[im 1/4]
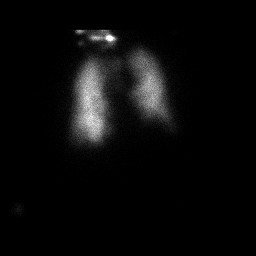
[im 1/4]
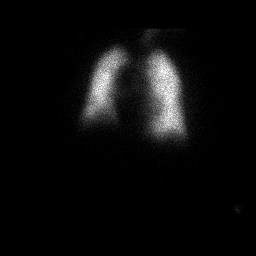
[im 2/4]
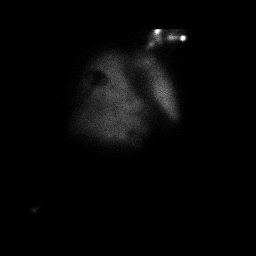
[im 2/4]
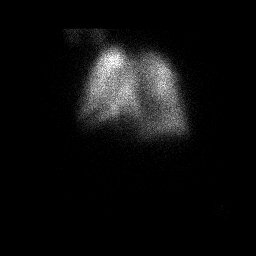
[im 3/4]
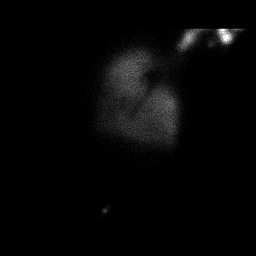
[im 3/4]
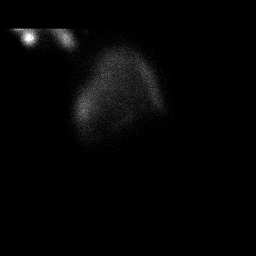
[im 4/4]
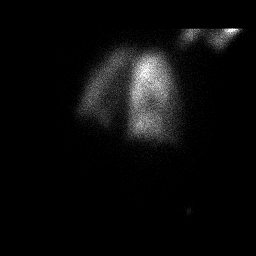
[im 4/4]
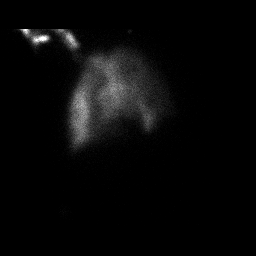

[16 of 16 positions shown; findings below may reference images not displayed]

PROCEDURE:     NM  - NM VQ LUNG SCAN  - [DATE]  [DATE] [DATE]  [DATE]

RESULT:     Following aerosol ministration of 37.6 mCi technetium 99m DTPA,
ventilation lung scan was performed an 8 views. After the ventilation scan
and following intravenous administration of 4.138 mCi technetium 99 M
macroaggregated albumin, perfusion lung scan was performed an 8 views. There
is a heterogeneous distribution of tracer activity in both lung scan, more
prominent on the ventilation scan and most consistent with ventilatory
disease. No mismatched perfusion defects suspicious for pulmonary emboli are
identified.
IMPRESSION: 1. Ventilation perfusion lung scan is low probability for pulmonary embolus.

## 2013-06-20 IMAGING — CR DG CHEST 2V
1 series · 2 of 2 positions shown · non-contrast
Comparison: none

REASON FOR EXAM: L sided pain
COMMENTS:

[Series 1: view not recorded · 0.17mm/px · 2 of 2 slices shown]
[im 1/2]
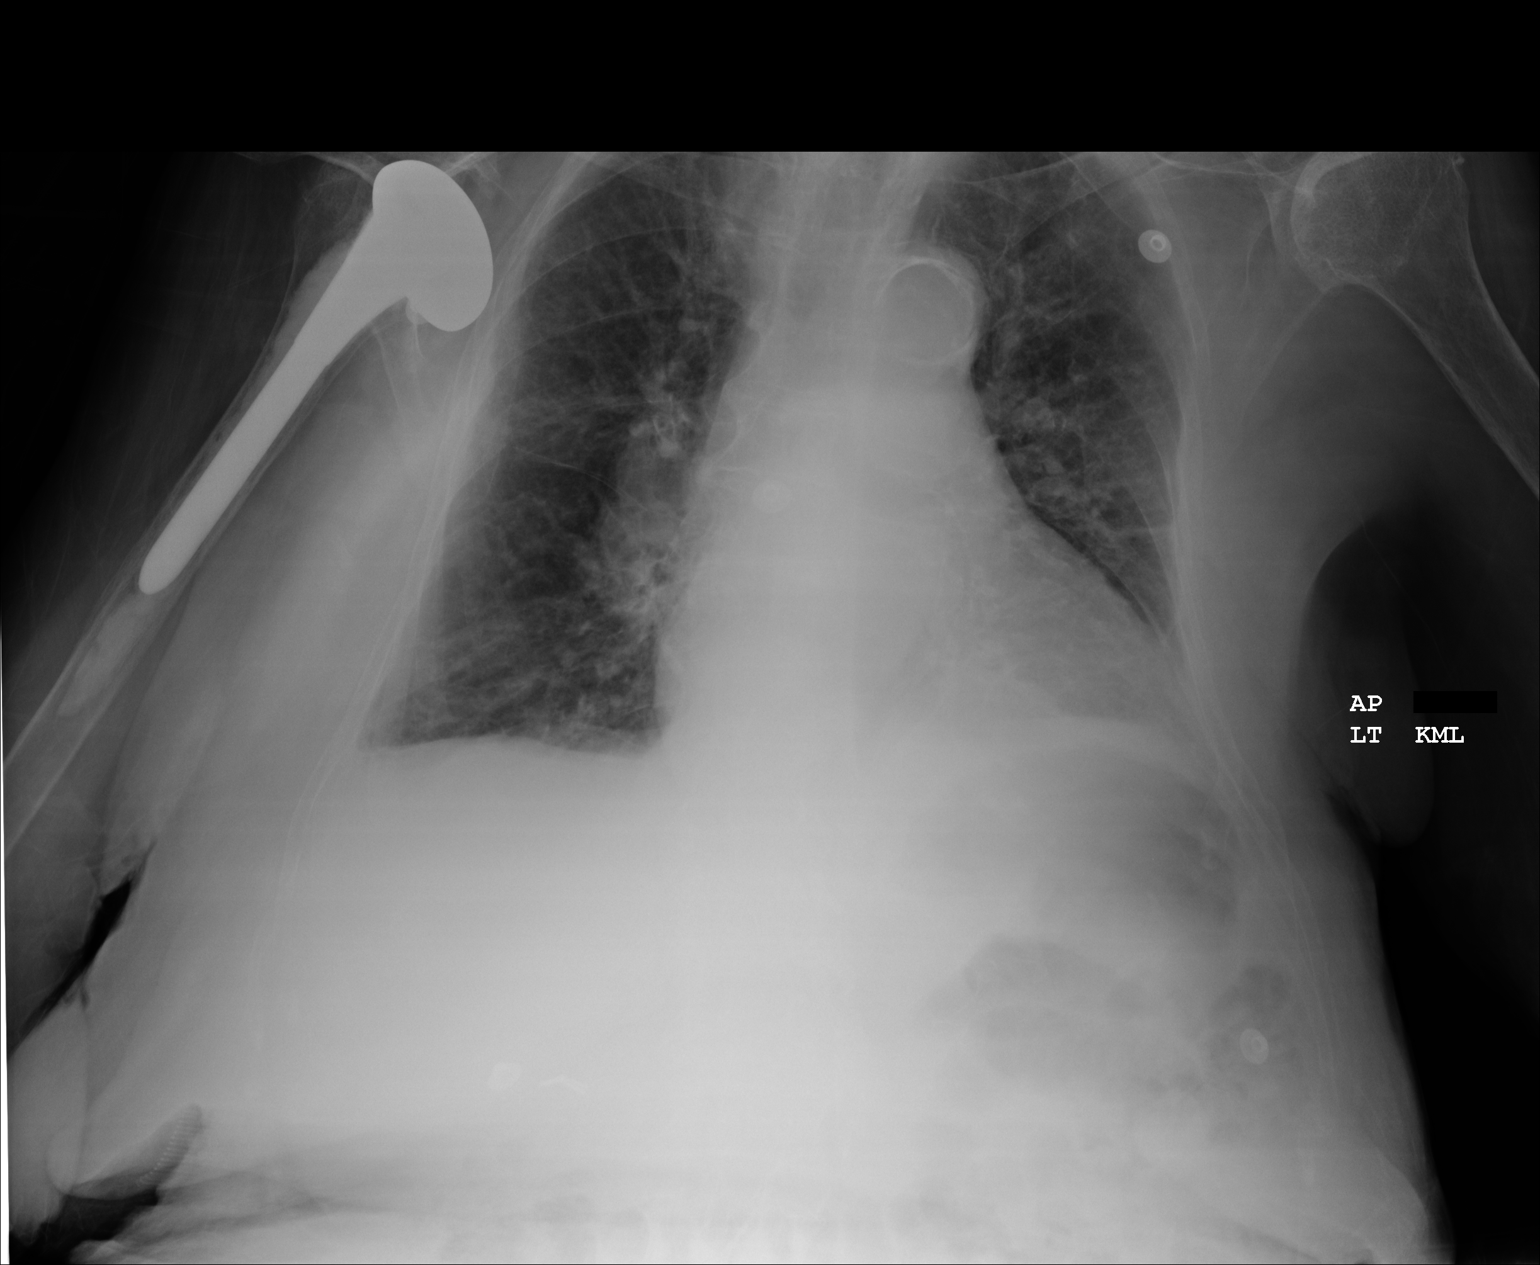
[im 2/2]
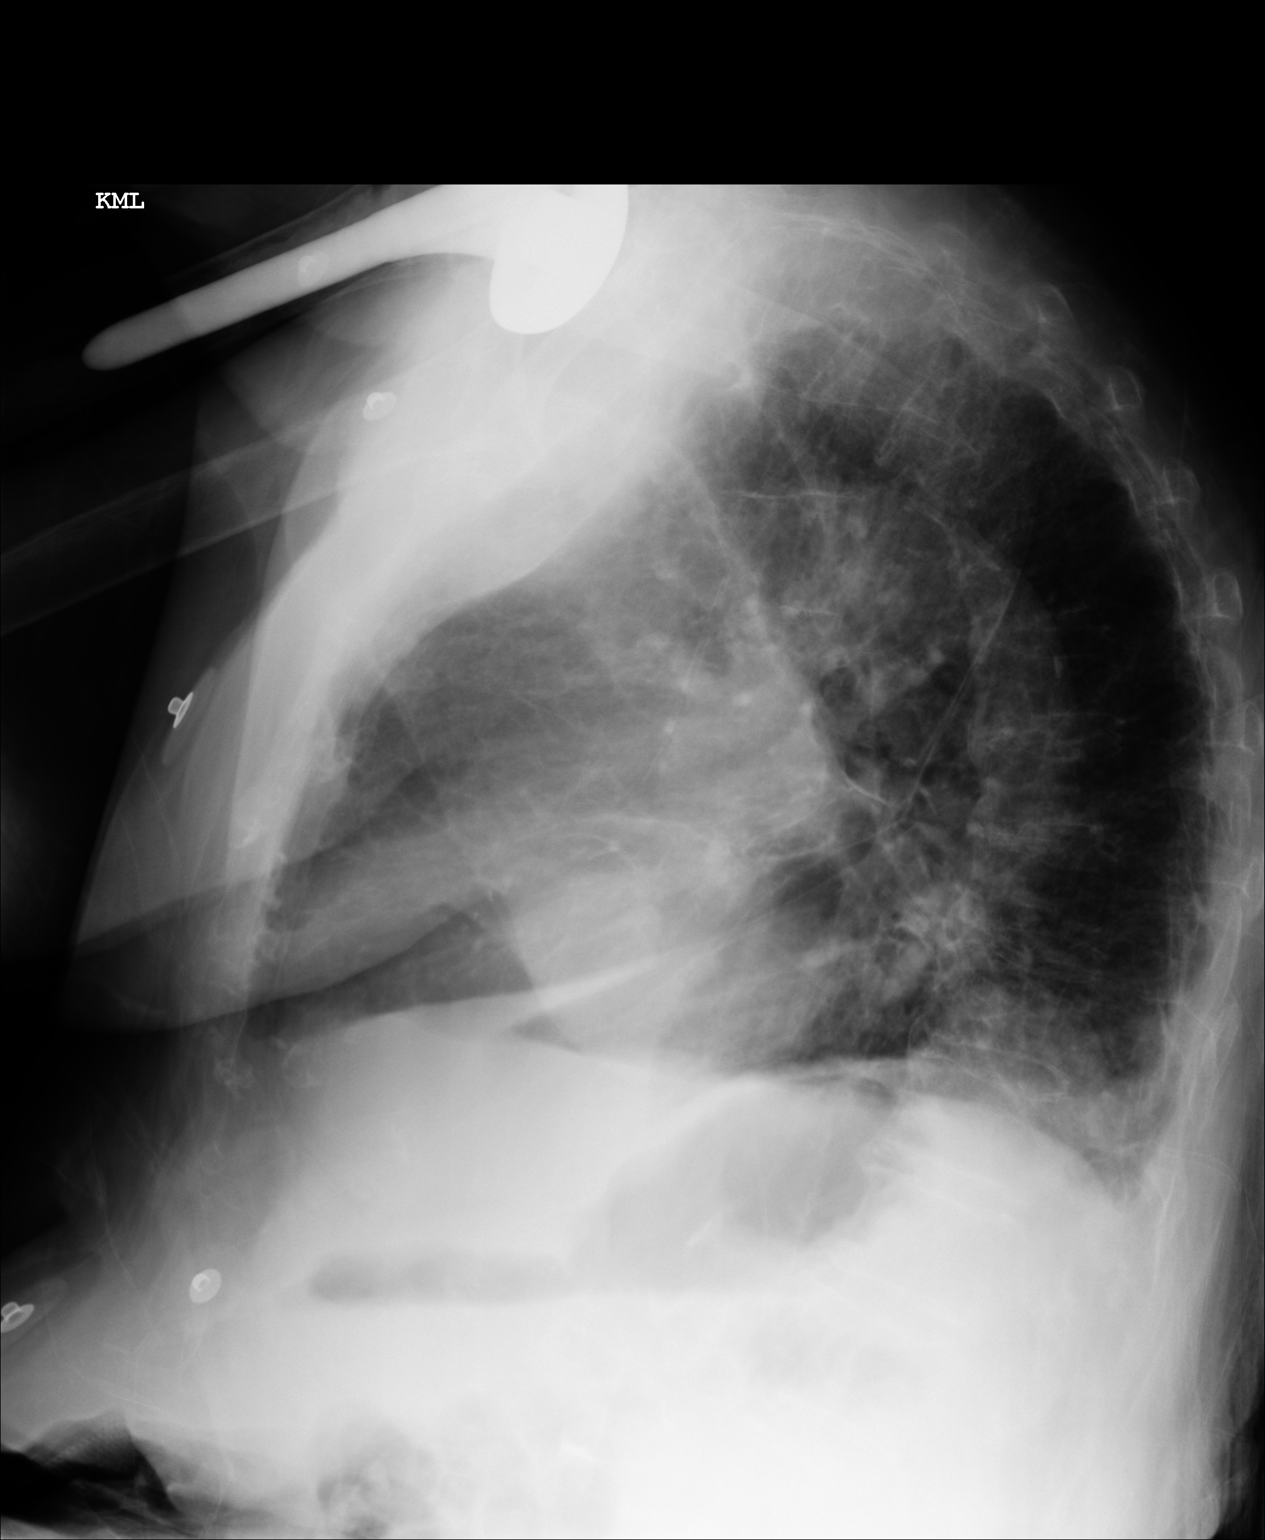

[2 of 2 positions shown; findings below may reference images not displayed]

PROCEDURE:     DXR - DXR CHEST PA (OR AP) AND LATERAL  - January 18, 2011  [DATE]

RESULT:     Comparison is made to the prior exam of 11/20/2010. There is
thickening of the interstitial lung markings compatible with pulmonary
fibrosis. No pneumonia is seen. No pneumothorax or pleural effusion is
identified. The heart is mildly enlarged but stable in size as compared to
the prior exam. There is a minimally displaced fracture of the seventh left
rib laterally. The patient has had prior hemiarthroplasty at the right
shoulder.
IMPRESSION: 1. Pulmonary fibrosis.
2. No pneumonia is identified.
3. There is a minimally displaced fracture of the seventh left rib laterally.

## 2013-11-13 IMAGING — US US EXTREM LOW VENOUS*L*
1 series · 18 of 24 positions shown · non-contrast
Comparison: none

REASON FOR EXAM: pain  swelling
COMMENTS:

PROCEDURE:     US  - US DOPPLER LOW EXTR LEFT  - June 13, 2011  [DATE]
RESULT:     Left lower extremity color-flow duplex Doppler reveals no deep
venous thrombosis.

[Series 1: us extrem low venous*left* · 18 of 31 slices shown]
[im 1/31]
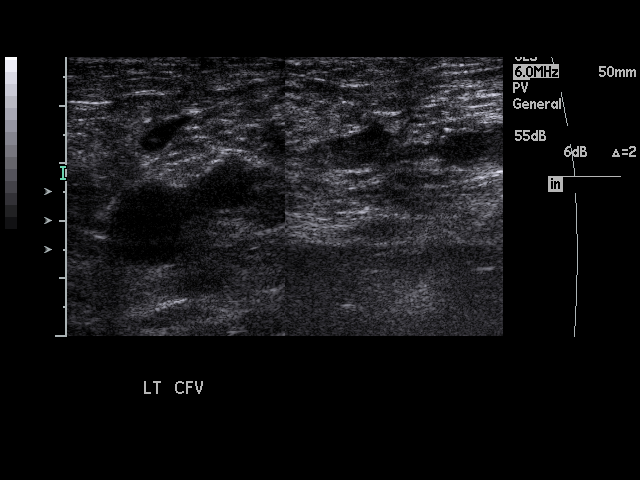
[im 3/31]
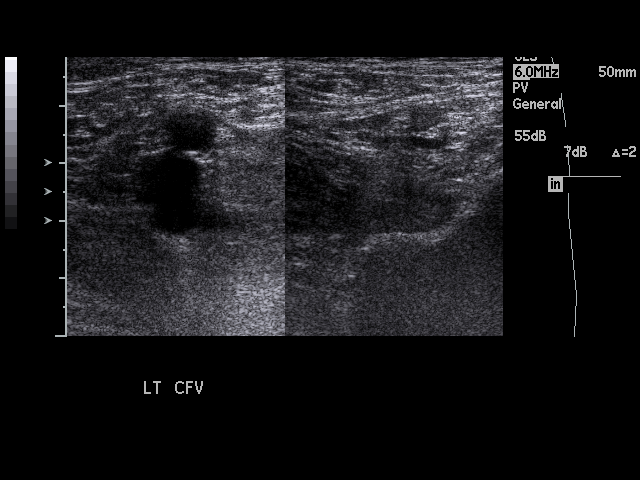
[im 4/31]
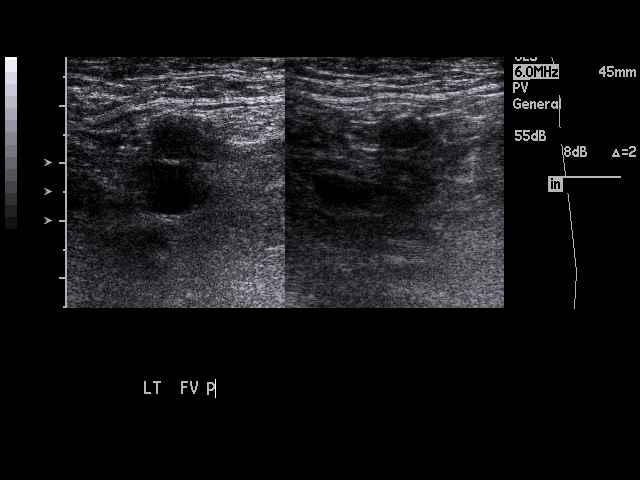
[im 6/31]
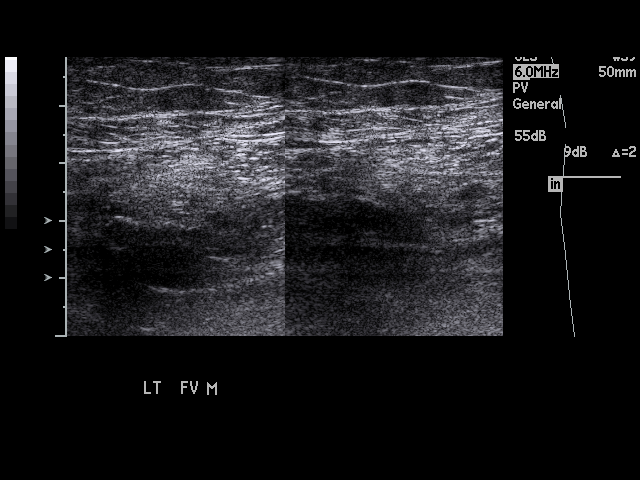
[im 8/31]
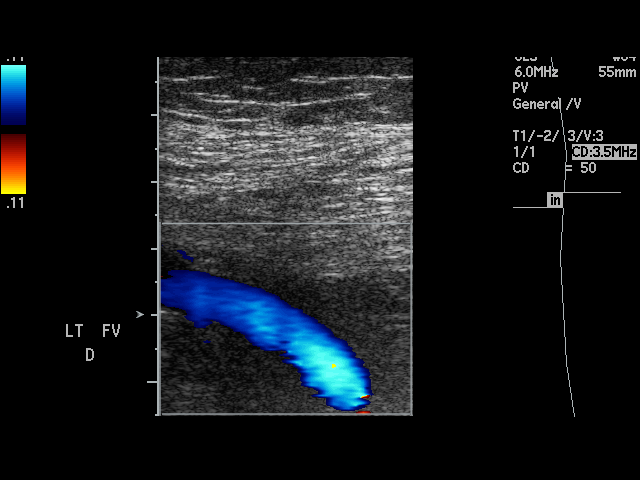
[im 10/31]
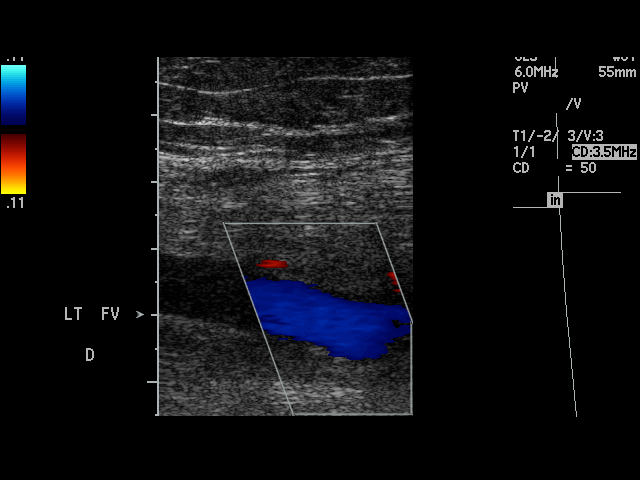
[im 11/31]
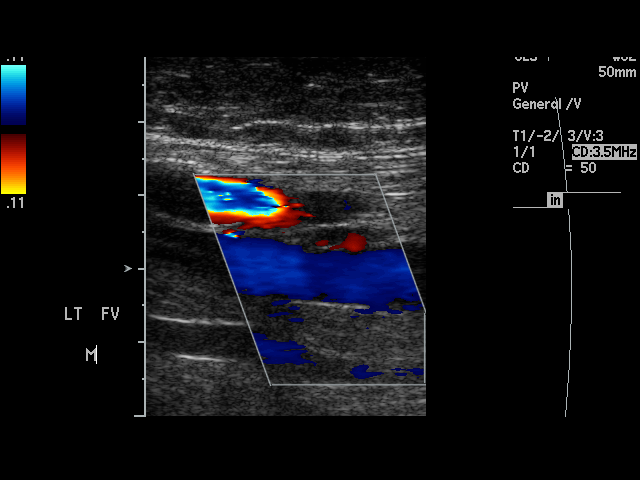
[im 14/31]
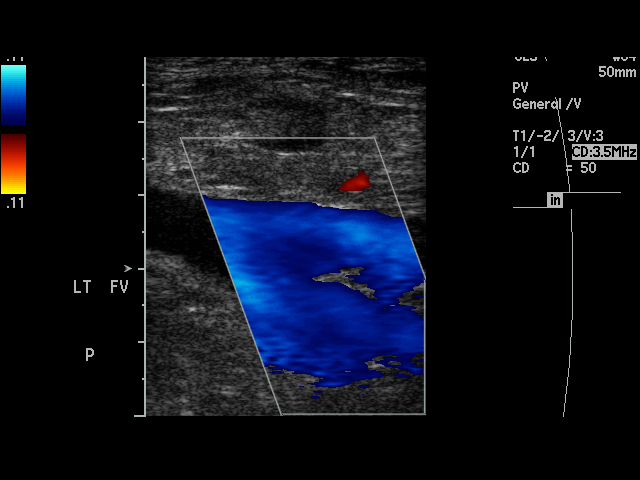
[im 15/31]
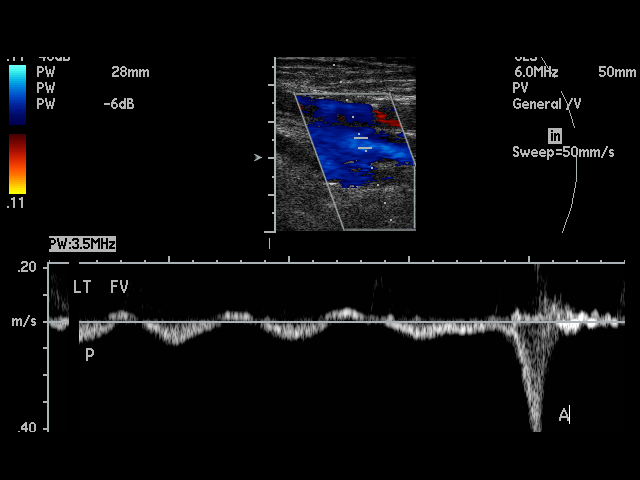
[im 16/31]
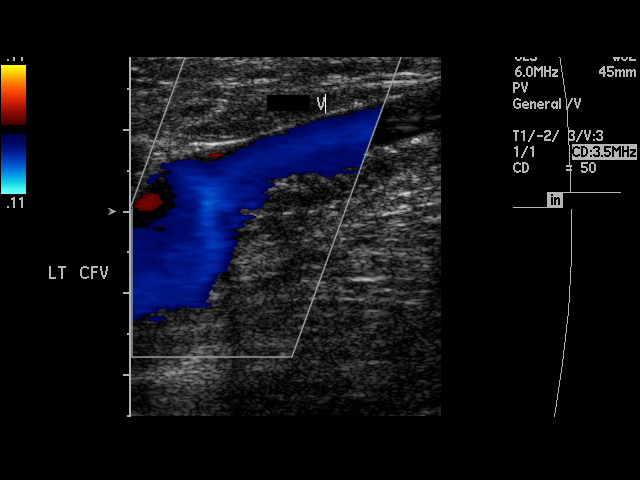
[im 19/31]
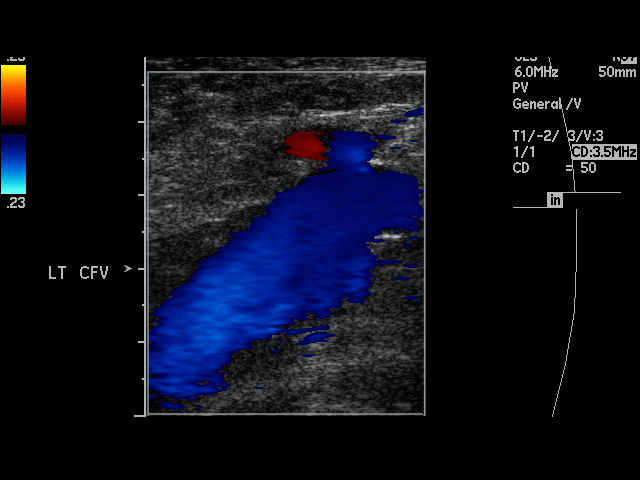
[im 20/31]
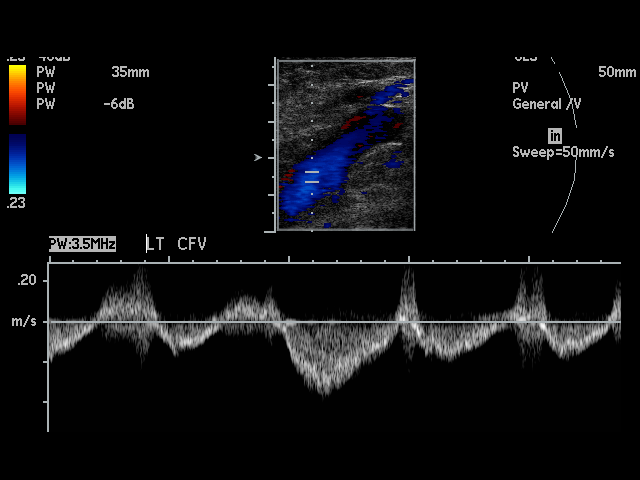
[im 21/31]
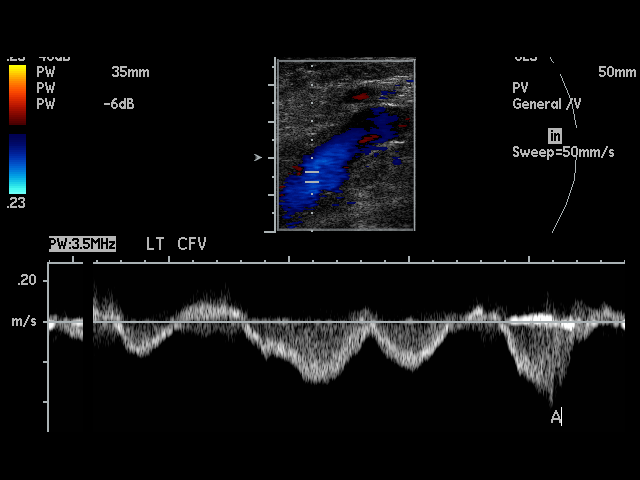
[im 24/31]
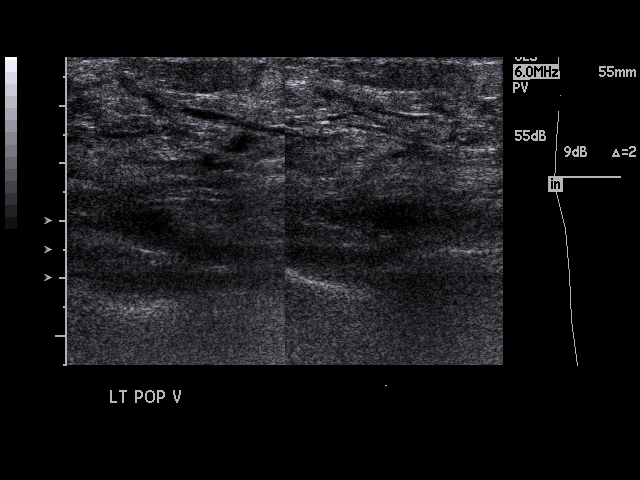
[im 25/31]
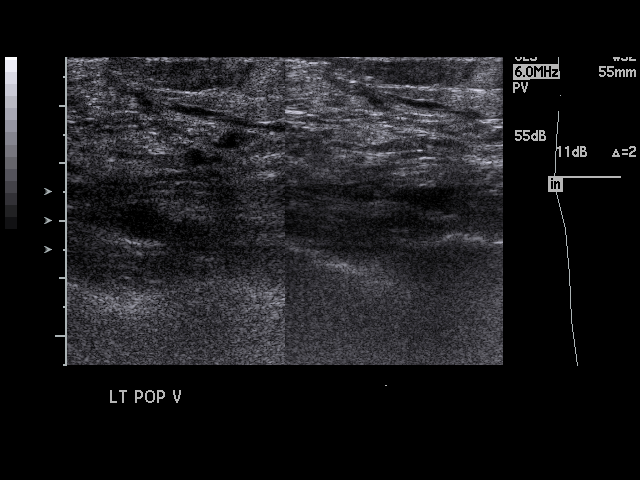
[im 27/31]
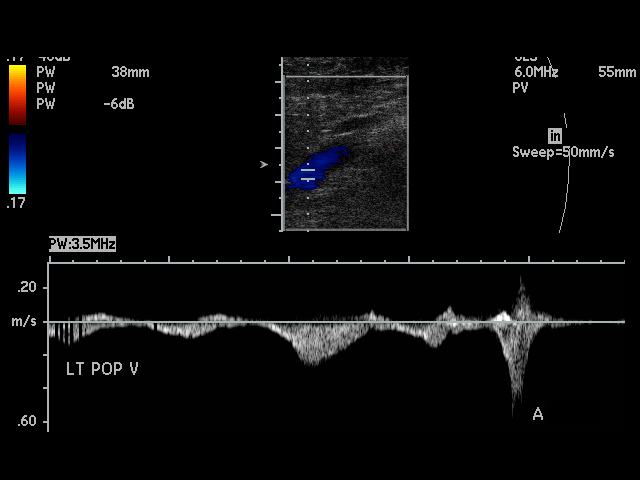
[im 29/31]
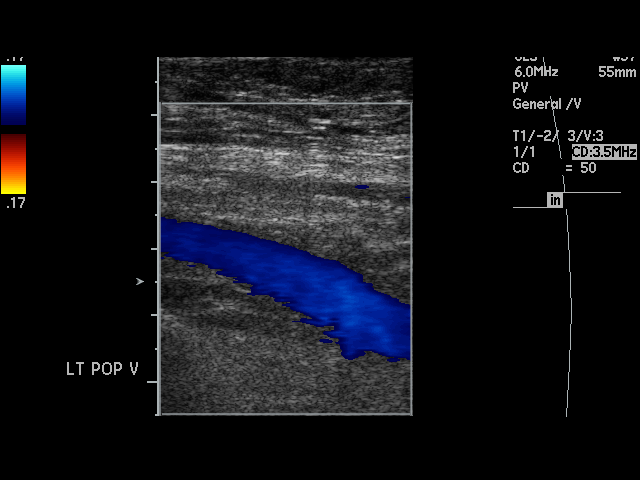
[im 31/31]
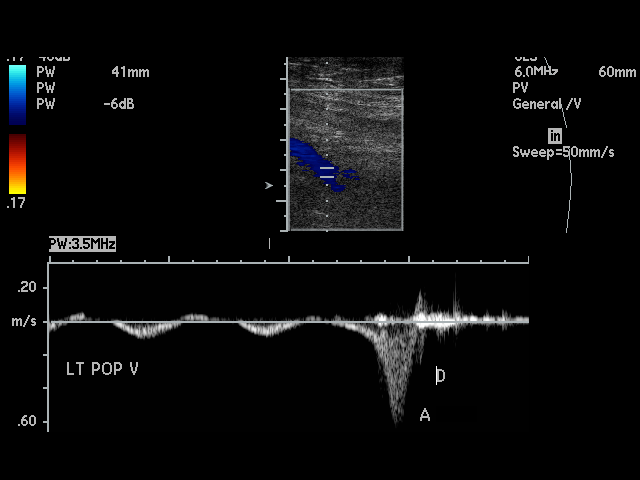

[18 of 24 positions shown; findings below may reference images not displayed]

IMPRESSION: Negative exam.

## 2013-11-13 IMAGING — CR DG CHEST 2V
1 series · 2 of 2 positions shown · non-contrast
Comparison: none

REASON FOR EXAM: bilat leg swelling
COMMENTS:

[Series 1: view not recorded · 0.17mm/px · 2 of 2 slices shown]
[im 1/2]
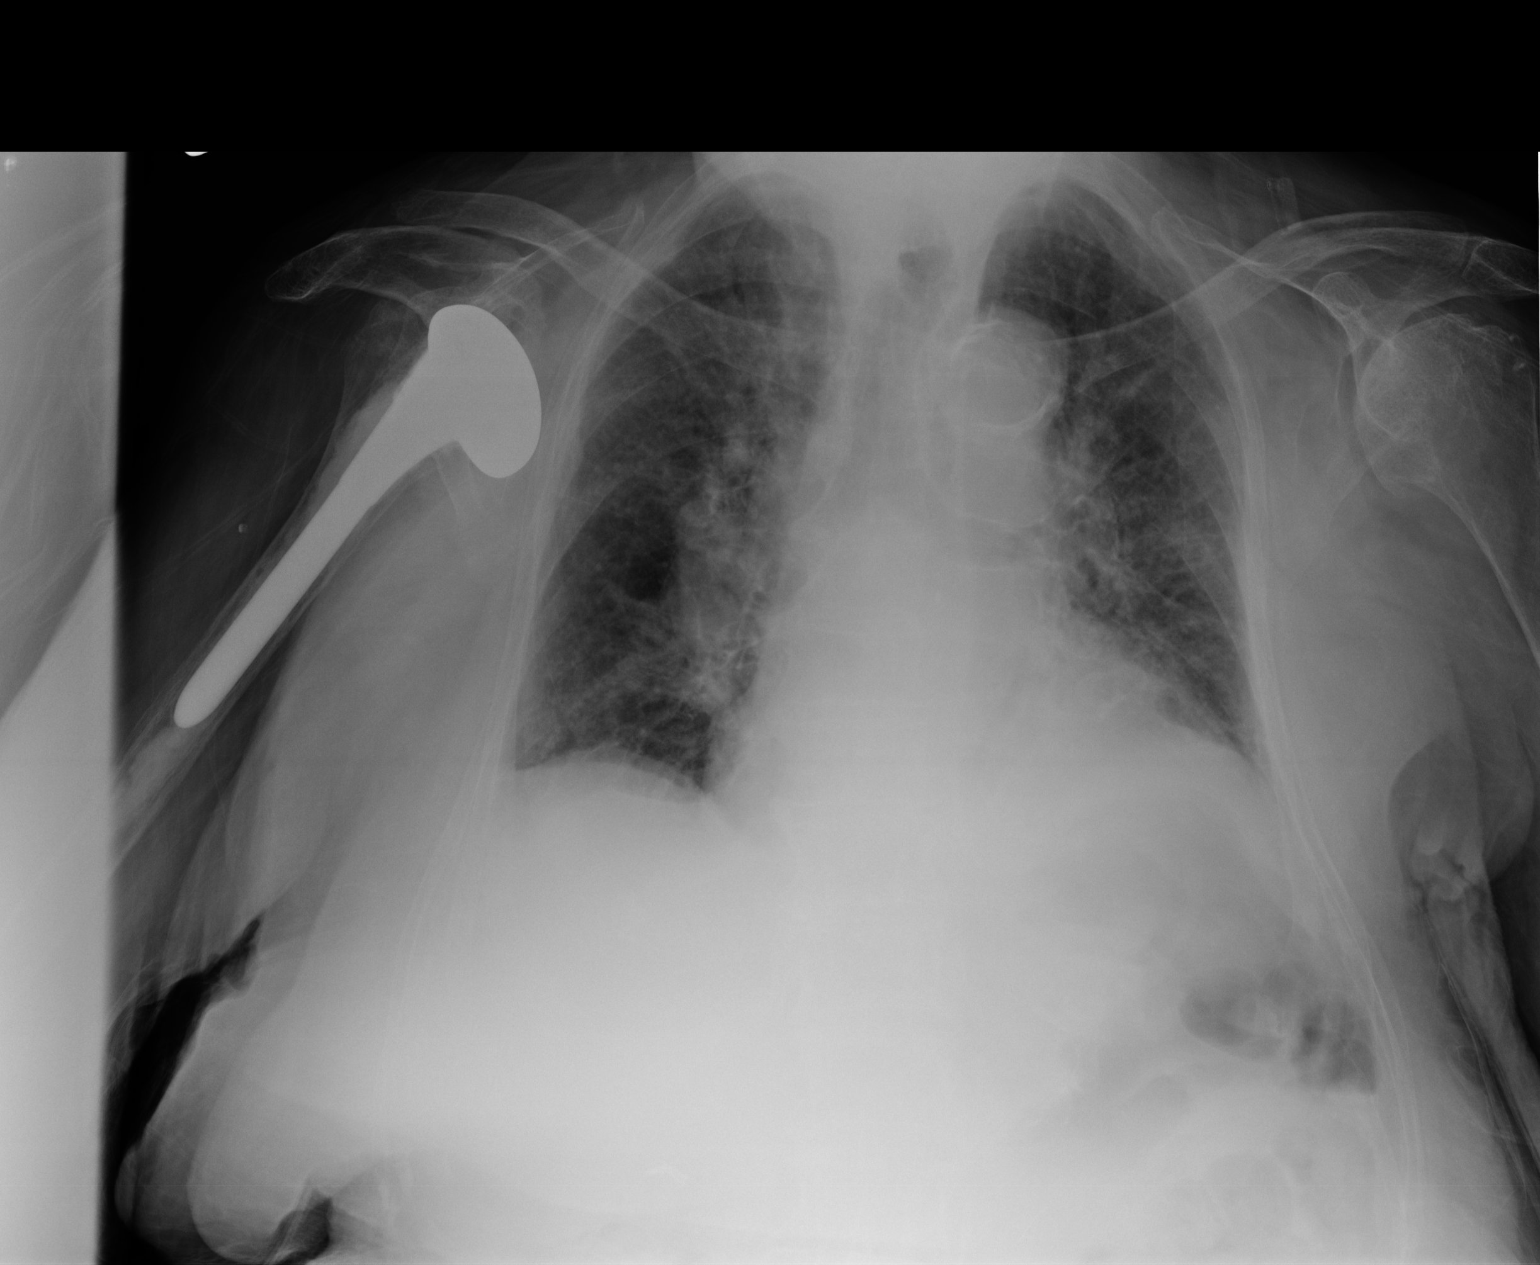
[im 2/2]
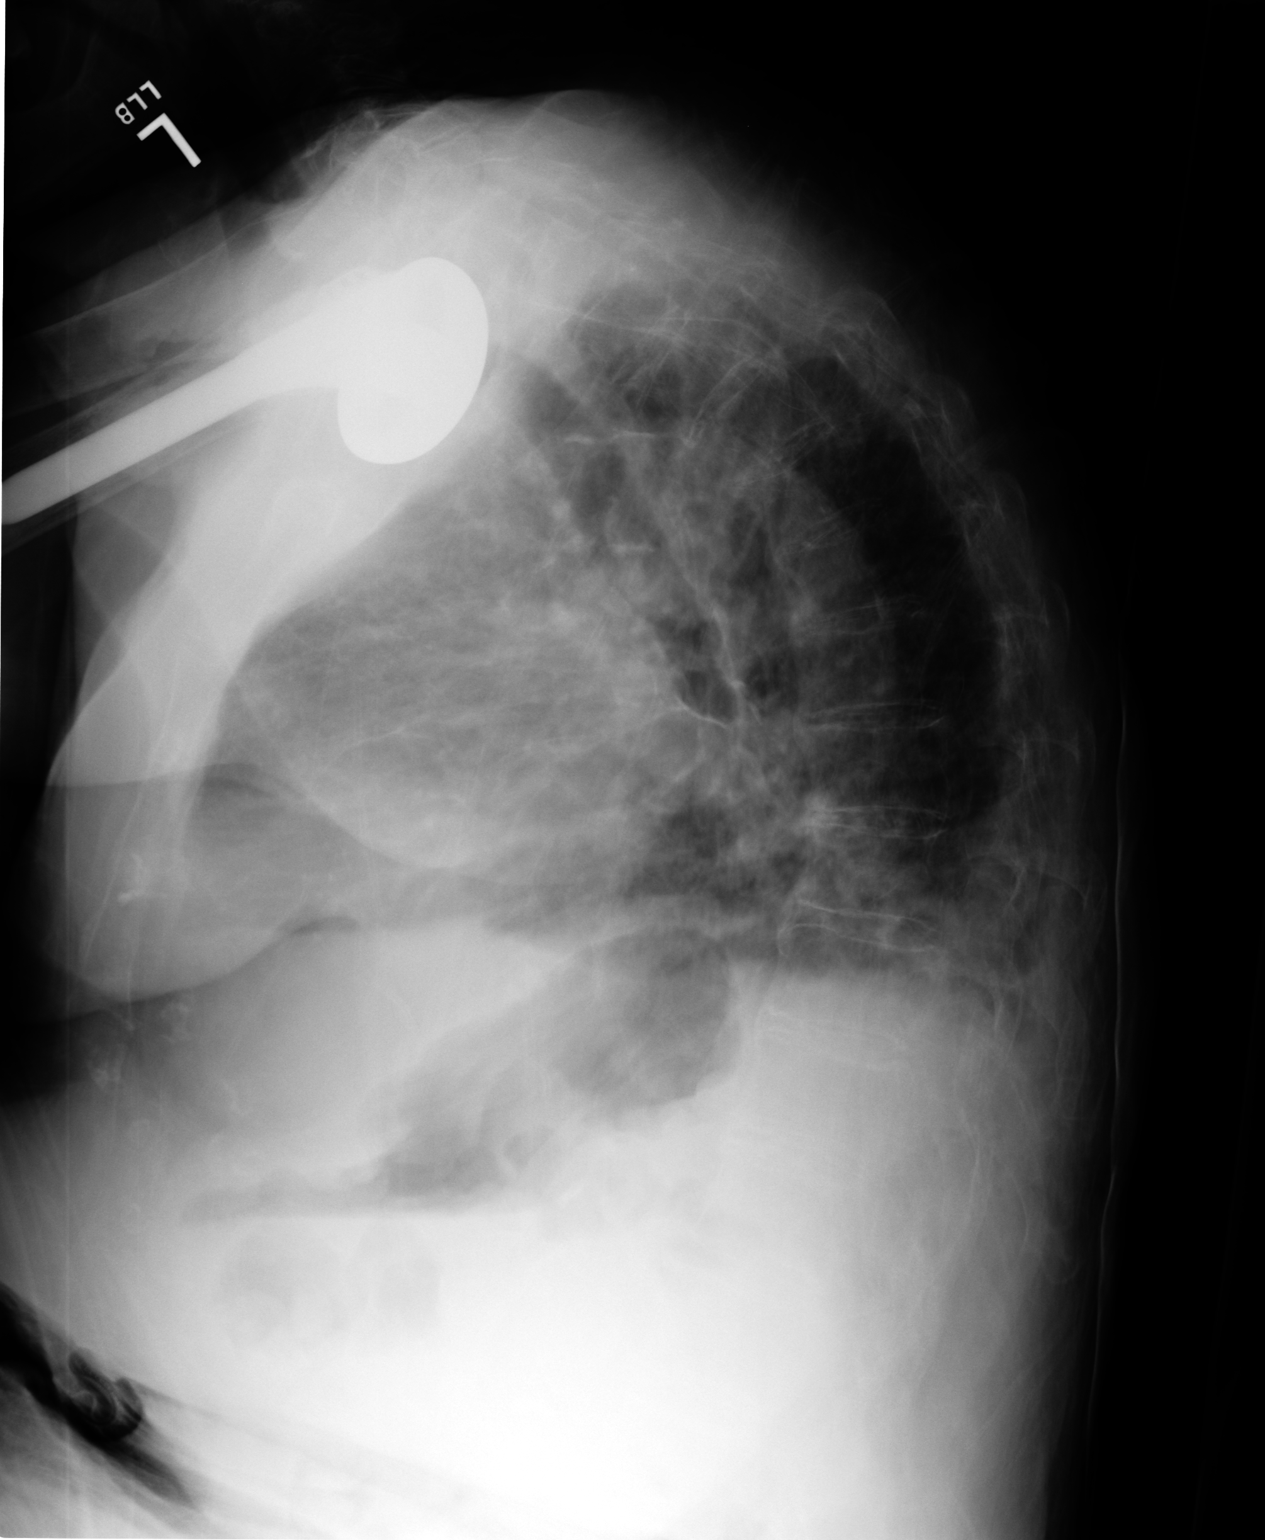

[2 of 2 positions shown; findings below may reference images not displayed]

PROCEDURE:     DXR - DXR CHEST PA (OR AP) AND LATERAL  - June 13, 2011  [DATE]

RESULT:     Comparison is made images of 04/09/2011.

Pulmonary vascular congestion with moderate interstitial edema is present.
The heart is enlarged. Atherosclerotic calcification is noted in the aorta.
Is no significant effusion or pneumothorax. Right shoulder hemiarthroplasty
changes are present.
IMPRESSION: 1. Cardiomegaly with pulmonary vascular congestion and interstitial
pulmonary edema.

## 2013-11-16 IMAGING — CR DG CHEST 2V
1 series · 2 of 2 positions shown · non-contrast
Comparison: none

REASON FOR EXAM: SOB, pulmonary edema
COMMENTS:

[Series 1: view not recorded · 0.17mm/px · 2 of 2 slices shown]
[im 1/2]
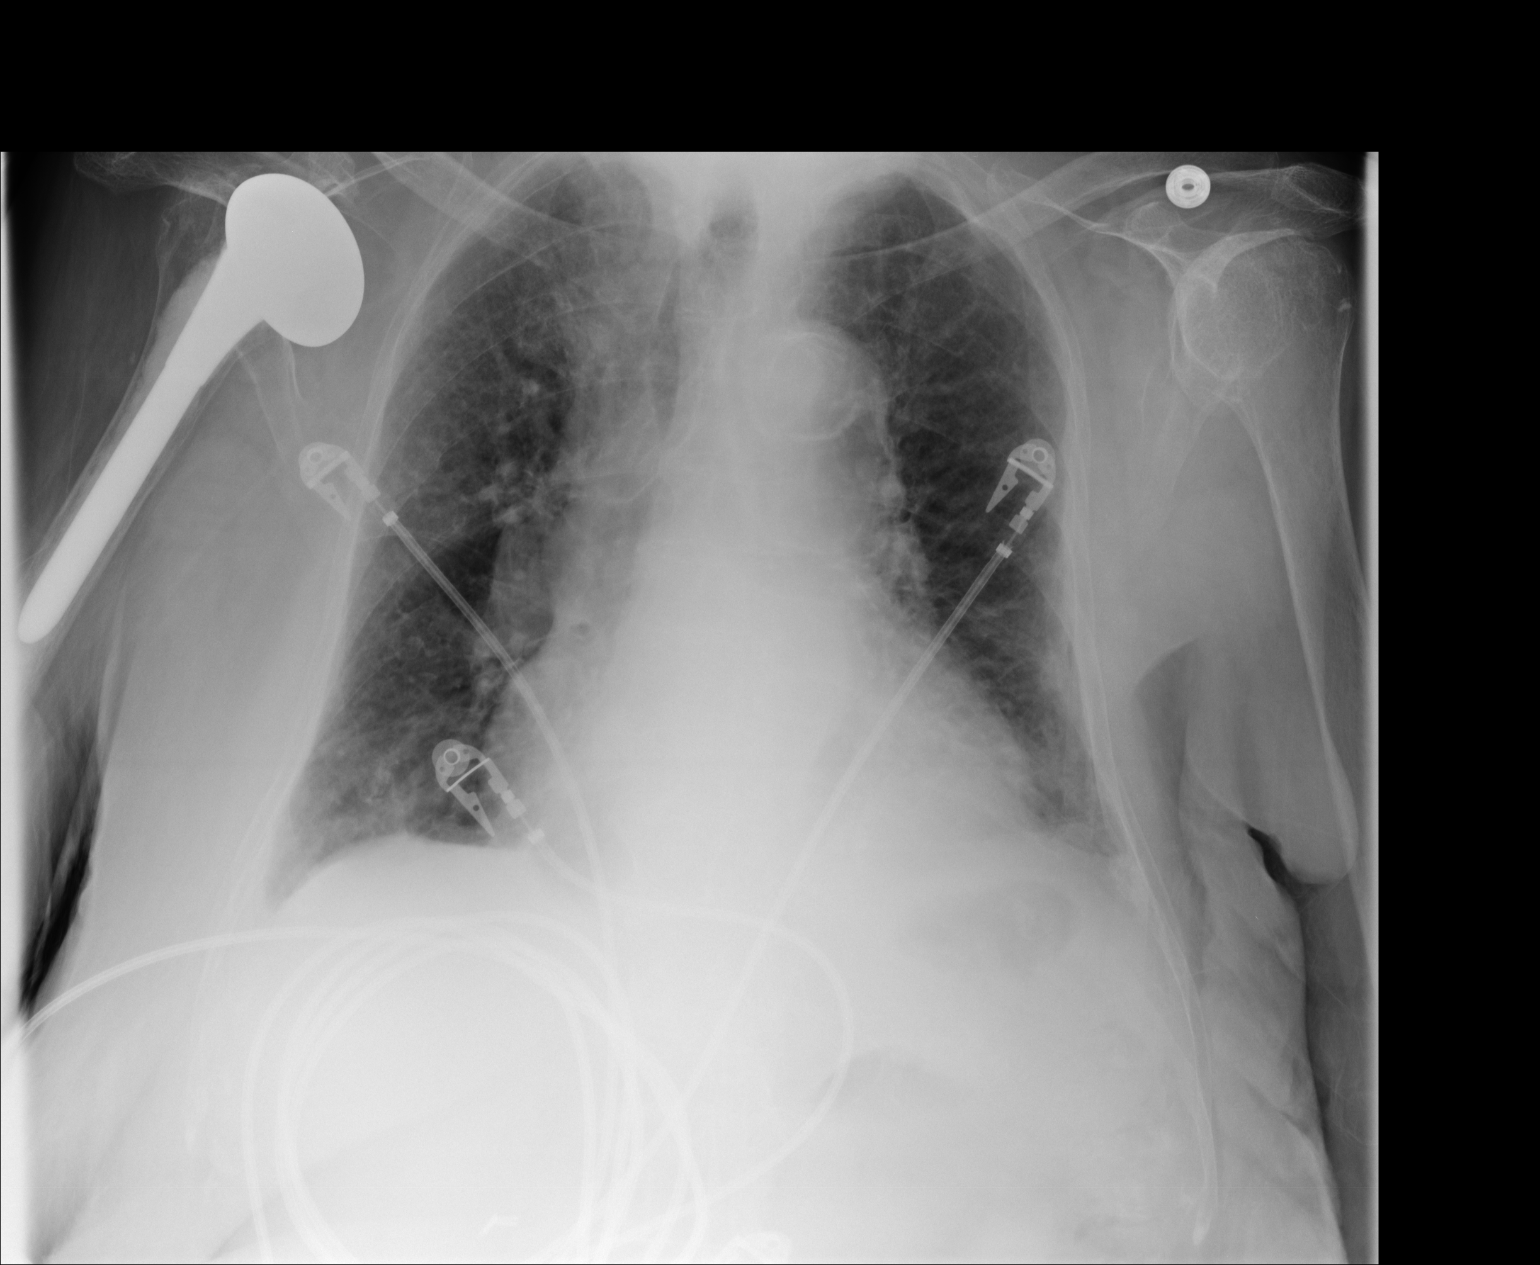
[im 2/2]
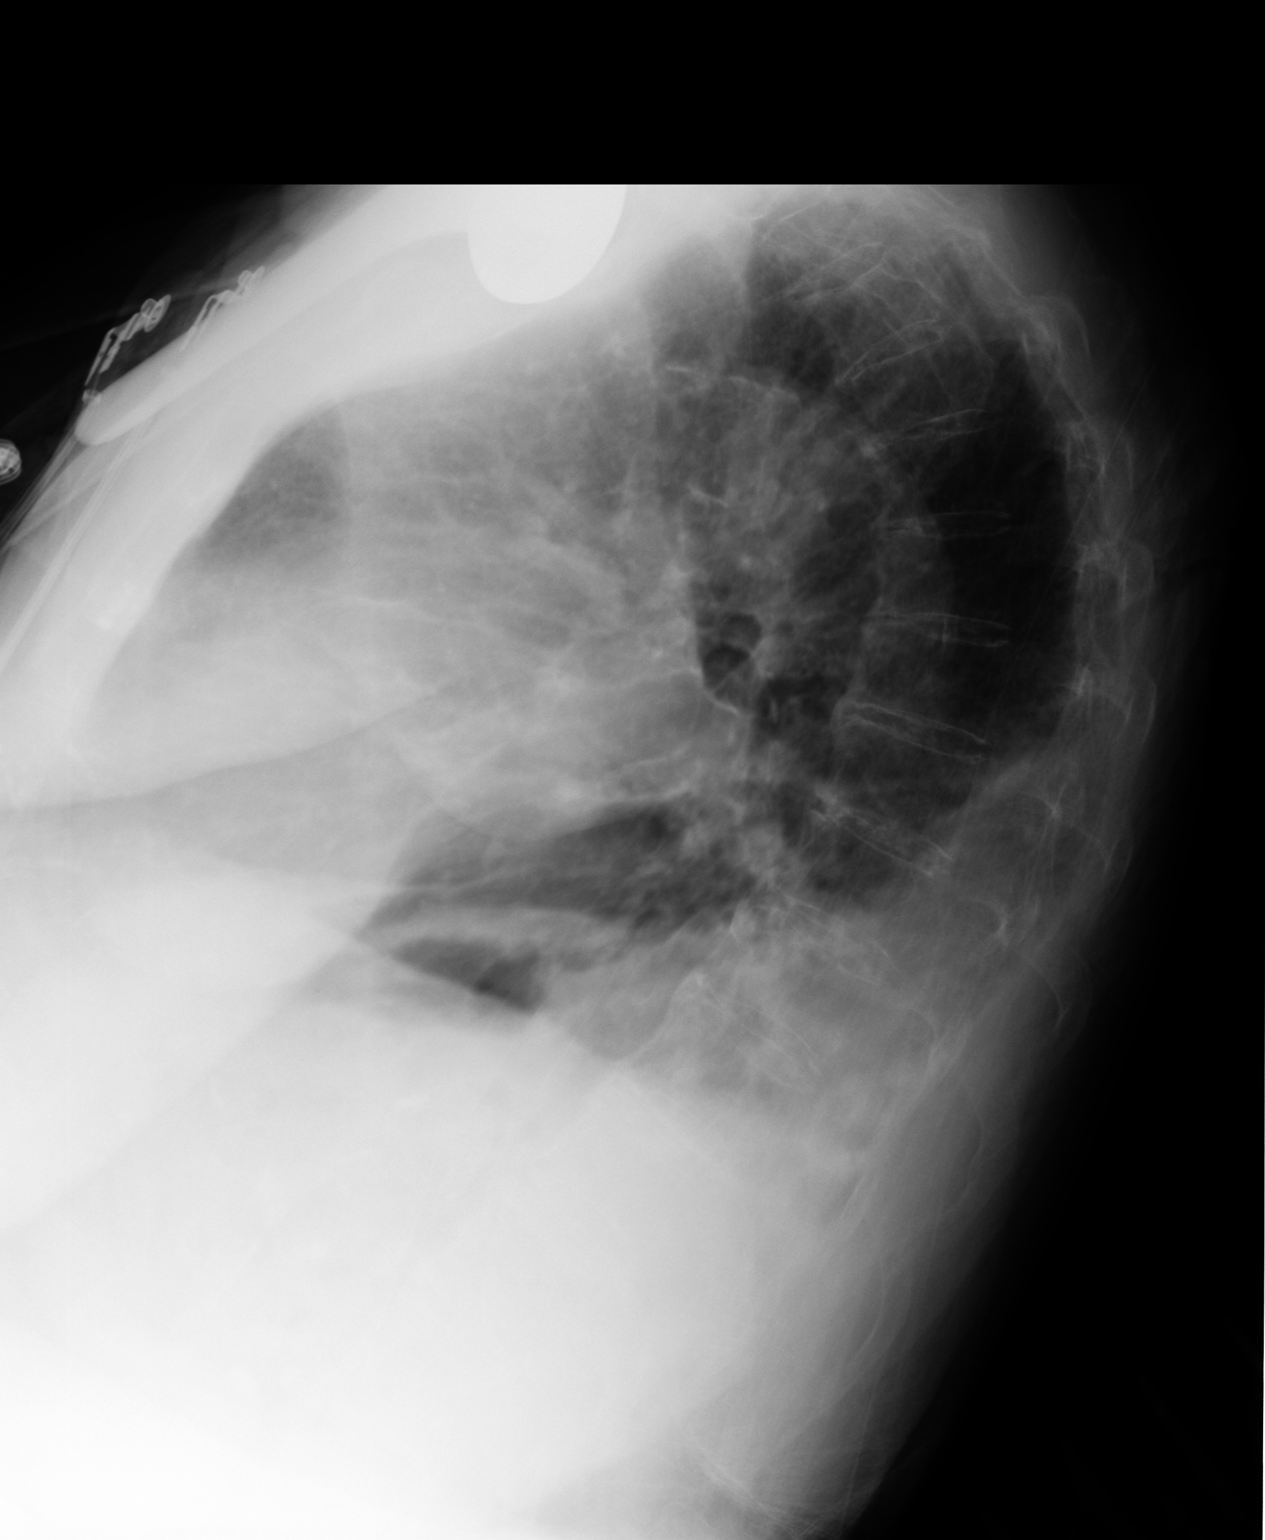

[2 of 2 positions shown; findings below may reference images not displayed]

PROCEDURE:     DXR - DXR CHEST PA (OR AP) AND LATERAL  - June 16, 2011  [DATE]

RESULT:     Comparison is made to the study 13 June, 2011.

The cardiac silhouette is enlarged. There is a small to moderate amount of
pleural effusion. There is diffuse interstitial edema. Atherosclerotic
calcification is noted within the aortic arch. There is no definite
consolidation. Right shoulder arthroplasty changes are present.
IMPRESSION: Hyperinflation suggestive of COPD with superimposed
cardiomegaly, interstitial edema and pleural effusions consistent with
congestive heart failure. Correlate clinically. No significant improvement
is observed.

## 2014-11-14 NOTE — H&P (Signed)
PATIENT NAME:  Krista Harper, Krista Harper MR#:  [id number] DATE OF BIRTH:  Jan 14, 1923  DATE OF ADMISSION:  08/01/2011  REFERRING PHYSICIAN: Dr. Thomasene Lot PRIMARY CARE PHYSICIAN: Dr. Sallee Provencal and Dr. Ronnald Ramp of [place]   CHIEF COMPLAINT: Shortness of breath, abnormal chest x-ray.   HISTORY OF PRESENT ILLNESS: This is an 81 year old white female with past medical history significant for diastolic congestive heart failure, history of osteoarthritis, cellulitis, GI bleed, gastric angiodysplasia, gout, hypertension, urinary incontinence, chronic pain syndrome, chronic lower extremity edema, breast cancer status post mastectomy, rib fracture, chronic kidney disease stage IV who presents to the ER with shortness of breath. Patient had some shortness of breath last 2 to 3 days. She said she had been wheezing and having cough. She had chest x-ray which showed congestive heart failure so they referred her here from [facility]. Patient had some coughing and clear phlegm. No chest pain. She said her shortness of breath is not too bad. She had no chest pain, nausea, vomiting, diarrhea, fevers, chills, shakes. She had no lower extremity edema. We are asked to admit the patient for possible congestive heart failure versus bronchitis.   PAST MEDICAL HISTORY:  1. Congestive heart failure, diastolic, ejection fraction 55%.  2. Osteoarthritis. 3. Falls. 4. Gastroesophageal reflux disease.  5. GI bleeding. 6. Hypertension. 7. Gout. 8. Urinary incontinence.  9. Urinary tract infection.  10. Chronic pain syndrome. 11. Rib fracture. 12. Chronic kidney disease stage IV.  13. Breast cancer.    PAST SURGICAL HISTORY:  1. Bilateral hip replacement. 2. Right shoulder hemiarthroplasty. 3. Left mastectomy. 4. Cholecystectomy.   DRUG ALLERGIES: Clindamycin, Avapro, ACE inhibitor, ARBs.   MEDICATIONS AT [place]:   1. Artificial tears.  2. Calcium plus vitamin D 200 international units 1 tablet by mouth twice  daily. 3. Metoprolol 50 mg b.i.d.  4. Protonix 40 mg daily.  5. Hydralazine 100 mg t.i.d.  6. Ferrous sulfate 325 mg t.i.d.  7. Remeron 15 mg daily.  8. She recently completed cefoxitin course for seven days. 9. Percocet 5/325. 10. Bumex 1 mg daily. 11. Milk of magnesia 5 to 10 mL once a day at bedtime.  12. Oxygen 2 liters.    SOCIAL HISTORY: She lives at [facility]. She quit smoking in 1940s. No history of alcohol use. Her power of attorney is her niece.   FAMILY HISTORY: No history of coronary artery disease or diabetes or hypertension.   REVIEW OF SYSTEMS: CONSTITUTIONAL: No fever. She does have some fatigue but no weakness. EYES: No blurred vision, double vision, redness, inflammation, glaucoma. She does wear glasses. ENT: No tinnitus, ear pain, hearing loss, seasonal allergies, epistaxis, discharge. RESPIRATORY: She has some cough, wheezing. No hemoptysis. She has some mild dyspnea. No painful respirations. CARDIOVASCULAR: No chest pain, orthopnea. No edema, arrhythmias. She has dyspnea on exertion. No palpitations, syncope. GASTROINTESTINAL: No nausea, vomiting, diarrhea, abdominal pain, melena, gastroesophageal reflux disease. GENITOURINARY: No dysuria, hematuria, renal calculi, increased frequency, incontinence. GYN/BREAST: No breast mass, tenderness, vaginal discharge. ENDOCRINE: No polyuria, nocturia, thyroid problems, increased sweating, heat or cold intolerance. HEME/LYMPH: No anemia, easy bruising, swollen glands. INTEGUMENT: No acne, rash, change in mole, hair, or skin. MUSCULOSKELETAL: No pain in back, shoulder, knee, hip, arthritis, swelling, gout. NEUROLOGIC: No numbness, weakness, dysarthria, epilepsy, tremor, vertigo. PSYCH: No anxiety, insomnia, ADD, bipolar, depression.    PHYSICAL EXAMINATION:  VITAL SIGNS: Temperature 98.5, blood pressure 168/75, sating 95% on 2 liters, respiratory rate 18, heart rate 79.   GENERAL: Patient is well-developed,  well-nourished,  no apparent distress, alert and oriented x3.   HEENT: Pupils are equal, round, and reactive to light and accommodation. Anicteric sclerae. She has some difficulty hearing. Oropharynx clear.   NECK: No JVD. No thyromegaly. No lymphadenopathy. No carotid bruits.   LUNGS: Coarse breath sounds with scant wheezing. No crackles. No increased effort.   CARDIOVASCULAR: Regular rate and rhythm. Normal S1 and S2. No murmurs, gallops or rubs appreciated. Trace lower extremity edema. 1+ dorsalis pedis pulses.   BREASTS: Deferred.   ABDOMEN: Soft, nontender, nondistended. Positive bowel sounds.   GENITOURINARY: Deferred.   MUSCULOSKELETAL: Strength 5/5. No clubbing, cyanosis.   SKIN: No rashes, lesions, induration. She actually has dry skin and tenting of skin.   LYMPH: No lymphadenopathy in cervical or supraclavicular area.   NEUROLOGIC: Cranial nerves II through XII are intact. Follows commands.   PSYCH: Alert and oriented x3.   LABORATORY, DIAGNOSTIC AND RADIOLOGICAL DATA: Chest x-ray shows mild congestive heart failure. EKG shows sinus rhythm with right bundle branch block. No ST changes. Glucose 104, BNP 38,880, BUN 60, creatinine 2.41, sodium 137, potassium 3.9, chloride 101, bicarbonate 28, calcium 8.4, total protein 7.2, albumin 2.5, total bilirubin 0.4, alkaline phosphatase 77, AST 20, ALT 11, WBC 6.9, hemoglobin 9.8, hematocrit 29.8, platelets 237, MCV 97.   ASSESSMENT AND PLAN: This is a pleasant 81 year old white female with past medical history of diastolic congestive heart failure, chronic kidney disease stage IV, hypertension, anemia presents with shortness of breath and abnormal chest x-ray.  1. Shortness breath. Most likely from bronchitis. She has wheezing. Will give azithromycin, short course of steroids, Solu-Medrol, Advair, nebulizers. She has some congestive heart failure and elevated BNP. Will give one dose of 20 mg of IV Lasix and reassess, however, she  actually appears better than when she was in previous hospital when I actually saw her myself.   2. Diastolic congestive heart failure. Will give one dose of Lasix and restart her Bumex. Will continue metoprolol. She is unable to take ACE inhibitors or ARB due to allergy.  3. Chronic kidney disease stage IV. This seems at baseline. If it worsens will consider calling Dr. Holley Raring who has seen her in the past.  4. Malignant hypertension. Continue metoprolol and hydralazine and titrate as necessary.  5. Anemia of chronic kidney disease and iron deficiency anemia. Continue ferrous sulfate.  6. Hyperglycemia. This is most likely stress reaction.  7. Debilitation. Will get physical therapy evaluation.  8. Deep vein thrombosis prophylaxis. Maintain with aspirin and heparin subcutaneous. 9. CODE STATUS: DO NOT RESUSCITATE.  10. It should be noted patient was seen by palliative care and recommended for hospice services, however, patient agreed to this but wanted to go to [facility] where they do not have hospice. This may be an ongoing issue in the future as patient may have more frequent admissions and not be able to receive hospice services at [facility]. May need to consider addressing this with family.   TOTAL TIME SPENT ON ADMISSION: 55 minutes.   ____________________________ Judeth Horn Royden Purl, MD aaf:cms D: 08/01/2011 20:59:15 ET T: 08/02/2011 07:01:15 ET JOB#: [id number]  cc: Mike Craze A. Royden Purl, MD, <Dictator> Dr. Sallee Provencal and Ronnald Ramp of [place]  Joaquin Bend MD ELECTRONICALLY SIGNED 08/03/2011 17:15

## 2014-11-14 NOTE — H&P (Signed)
PATIENT NAME:  Krista Harper, Krista Harper MR#:  [id number] DATE OF BIRTH:  02-Sep-1922  DATE OF ADMISSION:  01/08/2012  PRIMARY CARE PHYSICIAN: Valetta Close, MD   CHIEF COMPLAINT: Weakness, not eating.   HISTORY OF PRESENT ILLNESS: The patient is an 81 year old female who was sent in for weakness and not eating. She has been having some pain in the back. She has recently finished a week of treatment of Levaquin for seven days which ended on the 13th for pneumonia. She has been following with Dr. Holley Raring, Nephrology, for chronic kidney disease. She was also sent in for fluid in the lungs. The patient is unable to give much history secondary to severe hearing loss and is unable to understand the questions. In the ER, she was found to have acute on chronic renal failure with a creatinine up to 0.14 with a GFR of 9. A BNP was elevated at 39,770, and chest x-ray showed bilateral diffuse interstitial thickening likely representing interstitial edema versus interstitial pneumonitis Hospitalist Services were contacted for further evaluation.   PAST MEDICAL HISTORY:  1. Congestive heart failure, diastolic in nature. 2. Osteoarthritis. 3. Falls.  4. Gastroesophageal reflux disease. 5. Gastrointestinal bleeding. 6. Hypertension.  7. Gout.  8. Urinary incontinence.  9. Chronic pain. 10. Rib fracture. 11. Chronic kidney disease, stage 4.  12. Breast cancer history. 13. Chronic respiratory failure, on oxygen.   PAST SURGICAL HISTORY:  1. Bilateral hip replacements. 2. Right shoulder hemiarthroplasty. 3. Left mastectomy. 4. Cholecystectomy.   ALLERGIES: ACE inhibitors, ARBs, clindamycin, Lyrica and Neosporin.   SOCIAL HISTORY: She lives over at [place]. She quit smoking a long time ago. No alcohol, no drug use.   FAMILY HISTORY: No history of heart disease or diabetes or hypertension, as per old chart. The patient is unable to tell me today.   MEDICATIONS: (As per Prescription Writer)   1. Albuterol nebulizer as needed.  2. Hydralazine 100 mg t.i.d.  3. Artificial Tears, 1 drop to both eyes b.i.d. 4. Azo-Standard 95, 1 tablet b.i.d. for dysuria. 5. Bumex 1 mg a day as needed for worsening edema. 6. Bumex 1 mg b.i.d., and  then it looks like it is increased to 2 mg b.i.d.  7. Calcium and vitamin D, 1 tablet twice a day.  8. Colcrys 0.6 mg every 2 hours as needed for gout relief.  9. Ditropan  XL 5 mg daily.  10. DuoNeb nebulizer 3 mL inhalation every 6 hours.  11. Ferrous sulfate 325 mg t.i.d.  12. Lopressor 100 mg b.i.d.  13. Milk of magnesia p.r.n. for constipation. 14. Nitro-Dur 0.2 mg patch remove in the evening.  15. Norvasc 10 mg daily.  16. Percocet 5/325 t.i.d. as needed for pain.  17. Protonix 40 mg daily.  18. Remeron 15 mg 0.5 tabs at night.  19. Trazodone 50 mg at bedtime.  20. Tylenol p.r.n.  21. Xanax 0.5 mg q.i.d.  22. Zyrtec 10 mg at bedtime.   REVIEW OF SYSTEMS: Review of systems is difficult to obtain secondary to decreased hearing.     PHYSICAL EXAMINATION:  VITAL SIGNS: Temperature 97.3, pulse 77, respirations 20, blood pressure 140/61, pulse 96% on oxygen.   GENERAL: Slight respiratory distress, using accessory muscles.   HEENT: Eyes: Conjunctivae and lids normal. Pupils are equal, round, and reactive to light. Unable to test extraocular muscles secondary to hard of hearing. Ears, nose, mouth, and throat: Tympanic membranes obscured by wax. Nasal mucosa no erythema. Throat no erythema. No exudate seen. Lips and  gums no lesions.   NECK: Positive JVD. No bruits. No lymphadenopathy. No thyromegaly. No thyroid nodules palpated.   RESPIRATORY: Positive use of accessory muscles to breathe, decreased breath sounds bilaterally. Positive rales at the bases and rhonchi throughout the entire lung field.   CARDIOVASCULAR: S1, S2 normal. Positive 2/6 systolic ejection murmur. Carotid upstroke 2+ bilaterally. No bruits.   EXTREMITIES: Dorsalis pedis  pulses 1+ bilaterally. 3+ edema bilateral lower extremity.   ABDOMEN: Soft, nontender. No organomegaly/splenomegaly. Normoactive bowel sounds. No masses felt.   LYMPHATIC: No lymph nodes in the neck.   MUSCULOSKELETAL: 3+ edema. No clubbing. No cyanosis.   SKIN: Bruising on the lower extremities. Bilateral lower extremity erythema, slight warmth to touch.   NEUROLOGICAL: Cranial nerves II through XII are grossly intact. Hearing impaired. Deep tendon reflexes are 1+ bilateral lower extremities.   PSYCHIATRIC: Difficult to test secondary to hearing loss. The patient is alert and oriented to person and place.    LABORATORY, DIAGNOSTIC AND RADIOLOGICAL DATA:  White blood cell count 9.5, hemoglobin and hematocrit 9.4 and 29.8, platelet count 327.  Glucose 99, BUN 141, creatinine 4.14, sodium 140, potassium 3.1, chloride 99, CO2 30, calcium 8.5. GFR 9.0. Albumin 2.4.  Troponin negative.  TSH 1.01.  BNP 39,770.  ABG showed pH of 7.42, pCO2 47, pO2 of 75, bicarbonate 30.5, oxygen saturation 95.1.  Chest x-ray shows bilateral interstitial thickening representing interstitial edema versus interstitial pneumonitis, infectious or inflammatory etiology.  EKG: Sinus rhythm, premature supraventricular complex, 61 beats per minute, no acute ST-T wave changes.   ASSESSMENT AND PLAN:  1. Acute on chronic respiratory failure: I will continue oxygen supplementation, continue to monitor respiratory status closely.  2. Acute diastolic congestive heart failure and anasarca: The patient does have an allergy to ACE inhibitors and ARBs, so this is contraindicated. We will switch Bumex over to IV Lasix 40 mg IV b.i.d., continue nitro paste, hydralazine and metoprolol. Close monitoring of kidney function  needed.  3. Acute on chronic renal failure: We will get a Nephrology consultation in the a.m. May end up needing dialysis to manage fluid. The patient could be having uremic symptoms with poor appetite.   4. Possible infection in the lungs: Was recently treated with Levaquin. I will switch to Rocephin and Zithromax for right now and add low-dose Solu-Medrol and continue nebulizers.  5. Hypertension: Blood pressure currently is okay. Continue current medications. 6. Gastroesophageal reflux disease: Continue Protonix.  7. Poor appetite: Could be secondary to acute on chronic renal failure and uremic symptoms.  8. Anemia: Continue ferrous sulfate.  9. Gout: We will hold off on colchicine at this point. Steroids should help.  10. Possible cellulitis of the lower extremities.  Rocephin should cover.  CODE STATUS: The patient is a DO NOT RESUSCITATE.        TIME SPENT ON ADMISSION: 55 minutes.  ____________________________ Tana Conch. Leslye Peer, MD rjw:cbb D: 01/08/2012 23:16:51 ET T: 01/09/2012 09:33:08 ET JOB#: [id number]  cc: Tana Conch. Leslye Peer, MD, <Dictator> Valetta Close, MD Marisue Brooklyn MD ELECTRONICALLY SIGNED 01/14/2012 12:14

## 2014-11-14 NOTE — Op Note (Signed)
PATIENT NAME:  Krista Harper, Krista Harper MR#:  [id number] DATE OF BIRTH:  [birth year]  DATE OF PROCEDURE:  01/09/2012  PREOPERATIVE DIAGNOSIS: End-stage renal disease requiring hemodialysis.   POSTOPERATIVE DIAGNOSIS: End-stage renal disease requiring hemodialysis.   PROCEDURE PERFORMED: Insertion right femoral triple-lumen temporary dialysis catheter with ultrasound guidance.   SURGEON: Katha Cabal, MD  DESCRIPTION OF PROCEDURE: Patient is in bed. She is confused and uremic. She has been followed as a stage IV dialysis patient but now presents with florid uremia and end-stage renal disease. She is therefore undergoing urgent dialysis and placement of a temporary dialysis catheter. Dr. Holley Raring has obtained permission from the guardian by phone.   Patient is positioned supine. The right groin is prepped and draped in a sterile fashion. Ultrasound is placed in a sterile sleeve. Femoral vein is identified. It is echolucent, homogeneous and compressible indicating patency. Image is recorded. Under direct ultrasound visualization after 1% lidocaine has been infiltrated, a Seldinger needle is inserted into the vein. J-wire is advanced easily. Counterincision is made with an 11 blade. Dilator is passed over the wire and the triple-lumen trialysis catheter is advanced without difficulty. Catheters lumens all aspirate and flush easily and the catheter is secured to the skin of the thigh with nylon suture. Sterile dressing is applied. Patient tolerated procedure well and there were no immediate complications.   ____________________________ Katha Cabal, MD ggs:cms D: 01/09/2012 20:41:12 ET T: 01/10/2012 09:17:28 ET JOB#: [id number]  cc: Katha Cabal, MD, <Dictator> Katha Cabal MD ELECTRONICALLY SIGNED 01/17/2012 10:03

## 2014-11-14 NOTE — Discharge Summary (Signed)
PATIENT NAME:  Krista Harper, NOLLER MR#:  [id number] DATE OF BIRTH:  11-14-1922  DATE OF ADMISSION:  01/08/2012 DATE OF DISCHARGE:  01/16/2012  DISCHARGE DIAGNOSES:  1. Acute on chronic respiratory failure, now resolved.  2. Acute diastolic heart failure and anasarca, well compensated at this time. Could be due to underlying renal disease. Will require outpatient hemodialysis evaluation by Dr. Candiss Norse.  3. Allergy to ACE inhibitors and ARBs so cannot be given.  4. Acute on chronic renal failure, stage IV, with worsening GFR, required a few sessions of hemodialysis while in the hospital. Echo is showing ejection fraction greater than 55% for dialysis, per nephrology.  5. Hypertension/tachycardia, stable now. 6. Acute bronchitis, on Zithromax, improved.   SECONDARY DIAGNOSES:  1. Diastolic heart failure. 2. Osteoarthritis.  3. History of falls.  4. Gastroesophageal reflux disease.  5. History of gastrointestinal bleed.  6. Hypertension. 7. Gout. 8. Chronic pain.  9. Chronic kidney disease stage IV.  10. History of breast cancer.  11. Chronic respiratory failure, on oxygen.   CONSULTANTS:  1. Hortencia Pilar, MD - Vascular Surgery. 2. Murlean Iba, MD - Nephrology. 3. Physical Therapy.  4. Palliative Care.   PROCEDURES/RADIOLOGY: Insertion of right femoral triple lumen temporary dialysis catheter by Dr. Delana Meyer on 01/09/2012.   Chest x-ray on 01/08/2012 showed bilateral small pleural effusion. Interstitial edema versus interstitial pneumonitis.   Bilateral kidney ultrasound on 01/09/2012 showed no hydronephrosis. No other acute changes. Left-sided pleural effusion.   Chest x-ray on 01/10/2012 showed improvement in the previously noted pulmonary vascular congestion and interstitial edema. Persistent left-sided pleural effusion. Stable cardiomegaly.   2-D echocardiogram on 01/09/2012 showed moderate size left-sided pleural effusion. No LV systolic dysfunction. Ejection fraction more  than 55%. Elevated RV systolic pressure more than 60 mmHg.   MAJOR LABORATORY PANEL: Intact PTH was elevated with a value of 118. Serum vitamin D level was low with a value of 26.9. HBsAg was negative.   HISTORY AND SHORT HOSPITAL COURSE: The patient is an 81 year old female with the above-mentioned medical problems who was admitted for acute on chronic respiratory failure thought to be secondary to congestive heart failure and/or volume overload due to need of dialysis. There was initial concern for pneumonia, which was ruled out subsequently as fluid was removed. The patient was evaluated by nephrology, Dr. Candiss Norse, who felt need of dialysis as she was getting uremic. Vascular surgery was consulted for placement of temporary dialysis catheter, which was placed on 01/09/2012 and subsequently she started getting dialysis. She did have some improvement, although not significant, and started making some urine so further need of long term hemodialysis was in question as she was still not at end-stage renal disease. Palliative Care consultation was obtained to evaluate long-term goals for this patient and the patient and family decided to go for whatever it takes to live longer with some quality and, after discussion with nephrology, as there was no further need of hemodialysis, the patient is being discharged back to her facility in fair condition and will need ongoing evaluation with nephrology as an outpatient to see if she will need long-term hemodialysis and whether she would be at end-stage renal disease at some point.   DISCHARGE PHYSICAL EXAMINATION:   VITALS: On the date of discharge, her temperature was 98.3, heart rate 78 per minute, respirations 18 per minute, blood pressure 122/70 mmHg, and she was saturating 92% on room air.   CARDIOVASCULAR: S1 and S2 normal. Systolic ejection murmur present.   LUNGS: Clear  to auscultation. Decreased breath sounds, on the left more than right. No rales or  rhonchi.   ABDOMEN: Soft and benign.   NEUROLOGIC: Nonfocal examination.   All other physical examination remained at baseline.   DISCHARGE MEDICATIONS:  1. Artificial Tears one drop to each eye twice a day.  2. Calcium with vitamin D 1 tablet p.o. twice a day. 3. Apresoline 100 mg twice a day. 4. FeroSul 1 tablet p.o. three times daily. 5. DuoNeb every six hours as needed.  6. Milk of Magnesia once daily at bedtime as needed.  7. Colcrys 0.6 mg 1 tablet p.o. every two hours as needed.  8. Azo-Standard 1 tablet p.o. twice a day. 9. Trazodone 50 mg p.o. at bedtime.  10. Albuterol twice daily via inhalation as needed.  11. Percocet 5/325 mg 1 tablet p.o. three times daily as needed.  12. Xanax 0.5 mg p.o. four times daily as needed.  13. Remeron 15 mg 1/2 tablet p.o. at bedtime as needed.  14. Norvasc 10 mg p.o. daily. 15. Ditropan XL 1 tablet p.o. daily.  16. Protonix 40 mg p.o. daily.  17. Bumex 1 mg p.o. twice a day. 18. Lopressor 100 mg p.o. twice a day.  19. Zyrtec 10 mg p.o. at bedtime.  20. Tylenol 650 mg p.o. every four hours as needed.  21. Nitro-Dur 0.2 mg transdermal patch once a day in the morning and remove old patch when applying new patch, alternating sides, and monitor for chest pain.  22. Bumex 1 mg p.o. daily as needed.  23. Bumex 2 mg p.o. twice a day for 2 days after which can be stopped.  24. Prednisone 60 mg p.o. daily, taper by 10 mg daily until finished.   DISCHARGE DIET: Low sodium, mechanical soft, renal diet. Thin liquids with gravy to chopped meats to moisten. Medications in puree as necessary. Tray set up at all meals. The patient must be placed upright with head forward for safe oral intake. Small single bites and sips. The patient likes vanilla Ensure with meals. She also likes yogurt. Check for diet preferences at the facility.   DISCHARGE ACTIVITY: As tolerated.   DISCHARGE INSTRUCTIONS AND FOLLOW-UP: The patient was instructed to follow up with  her primary care physician, Dr. Bernie Covey, in 1 to 2 weeks. She will need followup with nephrology, Dr. Candiss Norse, in 2 weeks.   TOTAL TIME DISCHARGING THIS PATIENT: 55 minutes. ____________________________ Lucina Mellow. Manuella Ghazi, MD vss:slb D: 01/16/2012 14:18:12 ET T: 01/16/2012 14:45:10 ET JOB#: [id number]  cc: Lanisha Stepanian S. Manuella Ghazi, MD, <Dictator> Valetta Close, MD Murlean Iba, MD [place] Lilian Kapur, MD Lucina Mellow [facility] MD ELECTRONICALLY SIGNED 01/20/2012 19:24

## 2014-11-14 NOTE — Discharge Summary (Signed)
PATIENT NAME:  Krista Harper, Krista Harper MR#:  [id number] DATE OF BIRTH:  Nov 12, 1922  DATE OF ADMISSION:  08/01/2011 DATE OF DISCHARGE:  08/02/2011  HISTORY AND PHYSICAL: For a detailed note, please take a look at the History and Physical done by Dr. Royden Purl on admission.   DIAGNOSES AT DISCHARGE:  1. Acute bronchitis, likely viral in nature.  2. Shortness of breath due to acute bronchitis.   3. History of congestive heart failure, chronic diastolic dysfunction.  4. Gastroesophageal reflux disease.  5. Urinary incontinence.  6. Chronic lower extremity edema.  7. Hypertension.   DIET: The patient is being discharged on a low-sodium, low-fat diet.   ACTIVITY: As tolerated.   FOLLOWUP: The patient will follow up with the doctor at the skilled nursing facility. The patient is being discharged to [facility]. The physician there is Dr. Sallee Provencal.   DISCHARGE MEDICATIONS:  1. Protonix 40 mg daily.  2. Ditropan 5 mg daily.  3. Hydroxyzine 25 mg p.r.n. for itching.  4. Iron sulfate 325 mg t.i.d.  5. Calcium with vitamin D 1 tab b.i.d.  6. Artificial tears, 1 drop to each eye daily.  7. Milk of magnesia 5 to 10 mL at bedtime.  8. Nitroglycerin 0.3 mg patch daily.  9. Hydralazine 100 mg t.i.d.  10. Metoprolol tartrate 50 mg b.i.d.  11. Bumex 1 mg daily.  12. Remeron 50 mg at bedtime.  13. Prednisone taper starting at 60 mg, down to 10 mg over the next 6 days.  14. Zithromax 250 mg daily x3 days.  15. DuoNebs, albuterol-ipratropium nebulizer every 6 hours p.r.n. shortness of breath.   PERTINENT HOSPITAL STUDIES:  A chest x-ray done on admission showed findings suggestive of mild congestive heart failure. Urinalysis was negative for a urinary tract infection.   HOSPITAL COURSE: The patient is an 81 year old female with medical problems as mentioned above, presented to the hospital with shortness of breath and rattling in her chest.   1. Shortness of breath: This was likely secondary to acute  bronchitis. Although the patient's chest x-ray showed findings consistent with congestive heart failure, her symptoms are more clinically consistent with bronchitis. The patient received one dose of Lasix in the Emergency Room. She did respond well to the Lasix and also started on IV steroids along with a Z-Pak. The patient still has some wheezing, but it has improved since yesterday. She clinically feels better, does not complain of shortness of breath. She will be discharged back to the skilled nursing facility with a prednisone taper, finishing her Z-Pak, and also some nebulizer treatments.  2. History of congestive heart failure: The patient does have a history of diastolic dysfunction. She clinically did not present with congestive heart failure. She has chronic lower extremity edema. She does have a chronically elevated BNP. She did receive one dose of IV Lasix. She will resume her Bumex, beta blockers as mentioned above.  3. Chest pain, likely related to the bronchitis and shortness of breath: She  had three sets of cardiac enzymes checked which were negative. She is currently chest pain free. She will continue her nitroglycerin and a beta blocker.  4. Urinary incontinence: The patient is already on Ditropan. She will resume that.  5. Hypertension:  The patient did remain hemodynamically stable during the hospital course. She will resume her hydralazine and metoprolol as stated.  6. Gastroesophageal reflux disease: The patient was maintained on the Protonix, and she will resume that too.   CODE STATUS: The patient is a  DO NOT INTUBATE/DO NOT RESUSCITATE.   TIME SPENT: 35 minutes.  ____________________________ Belia Heman. Verdell Carmine, MD [id number] D: 08/02/2011 14:51:46 ET T: 08/02/2011 15:20:39 ET JOB#: [id number] Henreitta Leber MD ELECTRONICALLY SIGNED 08/06/2011 16:41
